# Patient Record
Sex: Male | Born: 1985 | Race: Black or African American | Hispanic: No | Smoking: Current every day smoker
Health system: Southern US, Community
[De-identification: ages and names within clinical notes are randomized; demographics above are authoritative.]

## PROBLEM LIST (undated history)

## (undated) ENCOUNTER — Ambulatory Visit

## (undated) DIAGNOSIS — D573 Sickle-cell trait: Secondary | ICD-10-CM

## (undated) HISTORY — PX: NO PAST SURGERIES: SHX2092

## (undated) HISTORY — DX: Sickle-cell trait: D57.3

---

## 2019-04-30 ENCOUNTER — Ambulatory Visit (HOSPITAL_COMMUNITY)
Admission: EM | Admit: 2019-04-30 | Discharge: 2019-04-30 | Disposition: A | Discharge status: Home / Self Care | Payer: Medicaid Other | Attending: Internal Medicine | Admitting: Internal Medicine

## 2019-04-30 ENCOUNTER — Encounter (HOSPITAL_COMMUNITY): Payer: Self-pay | Admitting: Emergency Medicine

## 2019-04-30 ENCOUNTER — Other Ambulatory Visit: Payer: Self-pay

## 2019-04-30 DIAGNOSIS — L84 Corns and callosities: Secondary | ICD-10-CM

## 2019-04-30 NOTE — ED Triage Notes (Signed)
Pt here for bilateral foot pain.

## 2019-04-30 NOTE — ED Provider Notes (Signed)
[facility]    CSN: [id number] Arrival date & time: 04/30/19  1035      History   Chief Complaint Chief Complaint  Patient presents with  . Foot Pain    HPI Robert Nunez is a 33 y.o. male with no past medical history comes to urgent care with complaints of pain in the left foot.  Patient has some calluses on his foot.  Left foot callus at the base of the left fifth toe is more prominent.  He has been shaving those calluses over the past 2 to 3 years.  He complains of worsening pain over the past few days.  Pain is constant, throbbing, worse with wearing shoes and denies any relieving factors.  Patient admits to having some whitish discharge from the area previously shaved.  No fever or chills.  No redness.Marland Kitchen   HPI  History reviewed. No pertinent past medical history.  There are no active problems to display for this patient.   History reviewed. No pertinent surgical history.     Home Medications    Prior to Admission medications   Not on File    Family History History reviewed. No pertinent family history.  Social History Social History   Tobacco Use  . Smoking status: Not on file  Substance Use Topics  . Alcohol use: Not on file  . Drug use: Not on file     Allergies   Patient has no allergy information on record.   Review of Systems Review of Systems  Constitutional: Negative for activity change, chills, fatigue and fever.  HENT: Negative.   Respiratory: Negative.   Gastrointestinal: Negative.   Genitourinary: Negative.   Musculoskeletal: Positive for arthralgias. Negative for back pain, joint swelling and myalgias.  Skin: Negative.   Neurological: Negative for dizziness, light-headedness and headaches.     Physical Exam Triage Vital Signs ED Triage Vitals [04/30/19 1120]  Enc Vitals Group     BP 107/72     Pulse Rate 69     Resp 18     Temp 97.9 F (36.6 C)     Temp Source Oral     SpO2 97 %     Weight    Height      Head Circumference      Peak Flow      Pain Score      Pain Loc      Pain Edu?      Excl. in [place]?    No data found.  Updated Vital Signs BP 107/72 (BP Location: Right Arm)   Pulse 69   Temp 97.9 F (36.6 C) (Oral)   Resp 18   SpO2 97%   Visual Acuity Right Eye Distance:   Left Eye Distance:   Bilateral Distance:    Right Eye Near:   Left Eye Near:    Bilateral Near:     Physical Exam Vitals signs and nursing note reviewed.  Constitutional:      General: He is not in acute distress.    Appearance: Normal appearance. He is not ill-appearing or toxic-appearing.  Cardiovascular:     Rate and Rhythm: Normal rate and regular rhythm.     Pulses: Normal pulses.     Heart sounds: Normal heart sounds.  Pulmonary:     Effort: Pulmonary effort is normal. No respiratory distress.     Breath sounds: Normal breath sounds. No wheezing or rales.  Abdominal:     General: Bowel sounds are normal.  There is no distension.     Palpations: Abdomen is soft.  Musculoskeletal:     Comments: Callus over the base of the left fifth toe.  Point tenderness.  It is obvious the callus has been shaved recently.  There is possible fluid collection that area.  Skin:    Capillary Refill: Capillary refill takes less than 2 seconds.  Neurological:     General: No focal deficit present.     Mental Status: He is alert.      UC Treatments / Results  Labs (all labs ordered are listed, but only abnormal results are displayed) Labs Reviewed - No data to display  EKG   Radiology No results found.  Procedures Incision and Drainage  Date/Time: 04/30/2019 1:07 PM Performed by: Merrilee JanskyLamptey,  O, MD Authorized by: Merrilee JanskyLamptey,  O, MD   Consent:    Consent obtained:  Verbal   Consent given by:  Patient   Risks discussed:  Bleeding and incomplete drainage   Alternatives discussed:  No treatment and delayed treatment Location:    Type:  Abscess   Location:  Lower extremity    Lower extremity location:  Foot   Foot location:  L foot Pre-procedure details:    Skin preparation:  Chloraprep Anesthesia (see MAR for exact dosages):    Anesthesia method:  Local infiltration   Local anesthetic:  Lidocaine 1% w/o epi Procedure type:    Complexity:  Simple Procedure details:    Incision types:  Single straight   Scalpel blade:  10   Drainage:  Bloody   Packing materials:  None Post-procedure details:    Patient tolerance of procedure:  Tolerated well, no immediate complications   (including critical care time)  Medications Ordered in UC Medications - No data to display  Initial Impression / Assessment and Plan / UC Course  I have reviewed the triage vital signs and the nursing notes.  Pertinent labs & imaging results that were available during my care of the patient were reviewed by me and considered in my medical decision making (see chart for details).     1.  Left foot callus with worsening pain-possible abscess: Incision and drainage did not yield any abscess. Tylenol/Motrin as needed for pain Local wound care. Patient is advised to follow-up with a podiatrist if he needs the calluses shaved or addressed Patient left the urgent care before formal discharge instructions were given. Final Clinical Impressions(s) / UC Diagnoses   Final diagnoses:  None   Discharge Instructions   None    ED Prescriptions    None     Controlled Substance Prescriptions [place] Controlled Substance Registry consulted? No   Merrilee JanskyLamptey,  O, MD 04/30/19 1308

## 2019-05-16 DIAGNOSIS — Z227 Latent tuberculosis: Secondary | ICD-10-CM

## 2019-05-16 HISTORY — DX: Latent tuberculosis: Z22.7

## 2019-05-25 ENCOUNTER — Encounter: Payer: Self-pay | Admitting: Family Medicine

## 2019-05-25 DIAGNOSIS — Z789 Other specified health status: Secondary | ICD-10-CM | POA: Insufficient documentation

## 2019-05-25 DIAGNOSIS — Z0289 Encounter for other administrative examinations: Secondary | ICD-10-CM | POA: Insufficient documentation

## 2019-05-25 DIAGNOSIS — Z603 Acculturation difficulty: Secondary | ICD-10-CM | POA: Insufficient documentation

## 2019-05-26 ENCOUNTER — Ambulatory Visit (INDEPENDENT_AMBULATORY_CARE_PROVIDER_SITE_OTHER): Payer: Medicaid Other | Admitting: Family Medicine

## 2019-05-26 ENCOUNTER — Other Ambulatory Visit: Payer: Self-pay

## 2019-05-26 VITALS — BP 100/62 | HR 77 | Ht 72.5 in | Wt 151.6 lb

## 2019-05-26 DIAGNOSIS — L84 Corns and callosities: Secondary | ICD-10-CM | POA: Diagnosis not present

## 2019-05-26 DIAGNOSIS — R52 Pain, unspecified: Secondary | ICD-10-CM | POA: Diagnosis not present

## 2019-05-26 DIAGNOSIS — Z0289 Encounter for other administrative examinations: Secondary | ICD-10-CM

## 2019-05-26 DIAGNOSIS — Z720 Tobacco use: Secondary | ICD-10-CM | POA: Diagnosis not present

## 2019-05-26 DIAGNOSIS — B070 Plantar wart: Secondary | ICD-10-CM | POA: Insufficient documentation

## 2019-05-26 DIAGNOSIS — R079 Chest pain, unspecified: Secondary | ICD-10-CM

## 2019-05-26 DIAGNOSIS — G44209 Tension-type headache, unspecified, not intractable: Secondary | ICD-10-CM | POA: Diagnosis not present

## 2019-05-26 MED ORDER — ACETAMINOPHEN 325 MG PO TABS: 650.0000 mg | ORAL_TABLET | Freq: Four times a day (QID) | ORAL | 3 refills | Status: DC | PRN

## 2019-05-26 NOTE — Patient Instructions (Addendum)
It was wonderful to see you today.  Thank you for choosing [facility].   Please call [phone] with any questions about today's appointment.  Please be sure to schedule follow up at the front  desk before you leave today.   Dorris Singh, MD  Family Medicine    Please go get your x-ray for the pain in your side  Tobacco use is damaging to your body. It increases your risk of stroke, heart attack, lung cancer, and serious lung disease in the future. It also reduces your fertility.   Quitting tobacco is the best thing for your health but is a challenge---nicotine, a chemical in cigarettes, is highly addictive.    Arabic Quitline, call 1300 [phone].                                     .             .             Marland Kitchen               .                       :      [place] ( )  :     Cold [place]             -        .     Cold [place]                               " "  .  :     :                 :    Nicotine Replacement Therapy (   ) (NRT) :               .   NRT                .     NRT          .      :        (Varenicline (Champix), Bupropion (Zyban  .         .  :      : the [place] Quitline (   )                .   [place]                  .                 .       (call-back)              .   [place]                 .    Quitline   [phone]             :    _0 .                                           .  :      :                        .                              .       :                                                                                                                     -         .             Marland Kitchen                             !             .                              Marland Kitchen                 Marland Kitchen               Marland Kitchen  Quitline     [phone]        .  Quit Kit (   )           .
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
## 2019-05-26 NOTE — Assessment & Plan Note (Signed)
Patient with likely tension headache. Normal neuro exam which is reassuring. No vision changes as well. Advised to use tylenol PRN. Strict return precautions given. Follow up PRN
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
## 2019-05-26 NOTE — Assessment & Plan Note (Signed)
Patient with Corn of foot. Instructed to use OTC products such as Dr. Zoe Lan. Follow up PRN

## 2019-05-26 NOTE — Assessment & Plan Note (Signed)
Congratulated patient on deciding to stop smoking. Provided resources for Arabic quit line as well as AVS with information on tobacco cessation. Follow up PRN

## 2019-05-28 ENCOUNTER — Encounter: Payer: Self-pay | Admitting: Family Medicine

## 2019-05-29 ENCOUNTER — Telehealth: Payer: Self-pay

## 2019-05-29 ENCOUNTER — Encounter: Payer: Self-pay | Admitting: Family Medicine

## 2019-05-29 ENCOUNTER — Other Ambulatory Visit: Payer: Self-pay

## 2019-05-29 ENCOUNTER — Ambulatory Visit (INDEPENDENT_AMBULATORY_CARE_PROVIDER_SITE_OTHER): Payer: Medicaid Other | Admitting: Family Medicine

## 2019-05-29 ENCOUNTER — Ambulatory Visit: Payer: Medicaid Other | Admitting: Family Medicine

## 2019-05-29 ENCOUNTER — Ambulatory Visit
Admission: RE | Admit: 2019-05-29 | Discharge: 2019-05-29 | Disposition: A | Discharge status: Home / Self Care | Payer: Medicaid Other | Source: Ambulatory Visit | Attending: Family Medicine | Admitting: Family Medicine

## 2019-05-29 VITALS — BP 100/65 | HR 66

## 2019-05-29 DIAGNOSIS — R7612 Nonspecific reaction to cell mediated immunity measurement of gamma interferon antigen response without active tuberculosis: Secondary | ICD-10-CM | POA: Diagnosis not present

## 2019-05-29 DIAGNOSIS — D573 Sickle-cell trait: Secondary | ICD-10-CM | POA: Diagnosis not present

## 2019-05-29 DIAGNOSIS — Z0289 Encounter for other administrative examinations: Secondary | ICD-10-CM | POA: Diagnosis not present

## 2019-05-29 LAB — POCT URINALYSIS DIP (MANUAL ENTRY)
Bilirubin, UA: NEGATIVE
Blood, UA: NEGATIVE
Glucose, UA: NEGATIVE mg/dL
Ketones, POC UA: NEGATIVE mg/dL
Leukocytes, UA: NEGATIVE
Nitrite, UA: NEGATIVE
Protein Ur, POC: NEGATIVE mg/dL
Spec Grav, UA: 1.025 (ref 1.010–1.025)
Urobilinogen, UA: 0.2 E.U./dL
pH, UA: 5.5 (ref 5.0–8.0)

## 2019-05-29 LAB — HGB FRAC. W/SOLUBILITY
Hgb A2 Quant: 4 % — ABNORMAL HIGH (ref 1.8–3.2)
Hgb A: 59.3 % — ABNORMAL LOW (ref 96.4–98.8)
Hgb C: 0 %
Hgb F Quant: 0 % (ref 0.0–2.0)
Hgb S: 36.7 % — ABNORMAL HIGH
Hgb Solubility: POSITIVE — AB
Hgb Variant: 0 %

## 2019-05-29 LAB — COMPREHENSIVE METABOLIC PANEL
ALT: 18 IU/L (ref 0–44)
AST: 20 IU/L (ref 0–40)
Albumin/Globulin Ratio: 1.6 (ref 1.2–2.2)
Albumin: 4.6 g/dL (ref 4.1–5.2)
Alkaline Phosphatase: 51 IU/L (ref 39–117)
BUN/Creatinine Ratio: 12 (ref 9–20)
BUN: 8 mg/dL (ref 6–20)
Bilirubin Total: 0.5 mg/dL (ref 0.0–1.2)
CO2: 21 mmol/L (ref 20–29)
Calcium: 9.3 mg/dL (ref 8.7–10.2)
Chloride: 103 mmol/L (ref 96–106)
Creatinine, Ser: 0.69 mg/dL — ABNORMAL LOW (ref 0.76–1.27)
GFR calc Af Amer: 147 mL/min/{1.73_m2} (ref >59)
GFR calc non Af Amer: 127 mL/min/{1.73_m2} (ref >59)
Globulin, Total: 2.8 g/dL (ref 1.5–4.5)
Glucose: 91 mg/dL (ref 65–99)
Potassium: 4.3 mmol/L (ref 3.5–5.2)
Sodium: 139 mmol/L (ref 134–144)
Total Protein: 7.4 g/dL (ref 6.0–8.5)

## 2019-05-29 LAB — QUANTIFERON-TB GOLD PLUS
QuantiFERON Mitogen Value: >10 IU/mL
QuantiFERON Nil Value: 0.19 IU/mL
QuantiFERON TB1 Ag Value: 4.66 IU/mL
QuantiFERON TB2 Ag Value: 3.64 IU/mL
QuantiFERON-TB Gold Plus: POSITIVE — AB

## 2019-05-29 LAB — HEPATITIS B CORE ANTIBODY, TOTAL: Hep B Core Total Ab: NEGATIVE

## 2019-05-29 LAB — HEPATITIS B SURFACE ANTIBODY, QUANTITATIVE: Hepatitis B Surf Ab Quant: <3.1 m[IU]/mL — ABNORMAL LOW (ref >9.9)

## 2019-05-29 LAB — CBC WITH DIFFERENTIAL/PLATELET
Basophils Absolute: 0 10*3/uL (ref 0.0–0.2)
Basos: 1 %
EOS (ABSOLUTE): 0.2 10*3/uL (ref 0.0–0.4)
Eos: 3 %
Hematocrit: 41.8 % (ref 37.5–51.0)
Hemoglobin: 14.2 g/dL (ref 13.0–17.7)
Immature Grans (Abs): 0 10*3/uL (ref 0.0–0.1)
Immature Granulocytes: 0 %
Lymphocytes Absolute: 2.6 10*3/uL (ref 0.7–3.1)
Lymphs: 34 %
MCH: 27.7 pg (ref 26.6–33.0)
MCHC: 34 g/dL (ref 31.5–35.7)
MCV: 82 fL (ref 79–97)
Monocytes Absolute: 0.8 10*3/uL (ref 0.1–0.9)
Monocytes: 10 %
Neutrophils Absolute: 4.1 10*3/uL (ref 1.4–7.0)
Neutrophils: 52 %
Platelets: 245 10*3/uL (ref 150–450)
RBC: 5.12 x10E6/uL (ref 4.14–5.80)
RDW: 12.6 % (ref 11.6–15.4)
WBC: 7.8 10*3/uL (ref 3.4–10.8)

## 2019-05-29 LAB — HEPATITIS B SURFACE ANTIGEN: Hepatitis B Surface Ag: NEGATIVE

## 2019-05-29 LAB — LIPID PANEL
Chol/HDL Ratio: 4.3 ratio (ref 0.0–5.0)
Cholesterol, Total: 165 mg/dL (ref 100–199)
HDL: 38 mg/dL — ABNORMAL LOW (ref >39)
LDL Calculated: 108 mg/dL — ABNORMAL HIGH (ref 0–99)
Triglycerides: 94 mg/dL (ref 0–149)
VLDL Cholesterol Cal: 19 mg/dL (ref 5–40)

## 2019-05-29 LAB — VARICELLA ZOSTER ANTIBODY, IGG: Varicella zoster IgG: 1538 index (ref >165)

## 2019-05-29 LAB — RPR: RPR Ser Ql: NONREACTIVE

## 2019-05-29 LAB — TSH: TSH: 1.22 u[IU]/mL (ref 0.450–4.500)

## 2019-05-29 LAB — HIV ANTIBODY (ROUTINE TESTING W REFLEX): HIV Screen 4th Generation wRfx: NONREACTIVE

## 2019-05-29 LAB — HCV COMMENT:

## 2019-05-29 LAB — HEPATITIS C ANTIBODY (REFLEX): HCV Ab: 0.3 s/co ratio (ref 0.0–0.9)

## 2019-05-29 NOTE — Patient Instructions (Addendum)
It was a pleasure seeing you today.   Today we discussed your lab results   For your labs:   Your Tuberculosis test was positive. We will get a chest xray. You can go today The address is [facility]  [address] [address]  Your sickle cell test shows you have sickle cell trait. THIS DOES NOT MEAN YOU HAVE THE DISEASE. Sickle cell trait means you can pass it down to your children. They have been tested already.   You will need to come back to get vaccinations. I have scheduled you for 06/15/19 @ 10:30AM.   Please follow up in 3 weeks for your vaccines or sooner if symptoms persist or worsen. Please call the clinic immediately if you have any concerns.   Our clinic's number is [phone]. Please call with questions or concerns.   Please go to the emergency room if fever, chest pain, shortness of breath, weight loss  Thank you,  Caroline More, DO

## 2019-05-29 NOTE — Assessment & Plan Note (Signed)
Discussed in detail diagnosis and how it is passed down from generation.  Drew pictures to describe what sickling of cells meant.  Patient verbalized that he now understood the diagnosis and will wait to see what his children's test results are.  He states he is unsure what his wife sickle cell status is.

## 2019-05-29 NOTE — Progress Notes (Signed)
   Subjective:    Patient ID: Robert Nunez, male    DOB: 1985-11-07, 33 y.o.   MRN: [id number]   CC: f/u lab work   HPI: F/u lab work Patient with recent visit on 8/11 with myself and Dr. Owens Shark for refugee clinic.  At that time lab work was obtained.  Sickle cell trait was shown.  Hepatitis C was negative.  TSH was within normal limits.  Lipid panel showed slightly elevated LDL of 108, this was not a fasting lab.  RPR was nonreactive hepatitis B antibody was low so will need hepatitis B series.  Varicella-zoster showed immunity.  HIV was nonreactive  Patient reports he will begin working for [employer] soon.  Reports that they do not require vaccinations prior to starting.  Patient is due for hepatitis B vaccine series, TDAP, hepatitis A vaccine, and recommended PCV 23 due to tobacco history.  Will need to make nursing visit to have these done.  Nursing visit made for 8/31 in the hopes that we will have vaccinations at that time.  Patient reports he has never been told that he had sickle cell trait.  Had lots of questions on what sickle cell disease was and how it is transferred from person to person.  Denies any shortness of breath, chest pain, cough, fever, weight changes.  Objective:  BP 100/65   Pulse 66   SpO2 99%  Vitals and nursing note reviewed  General: well nourished, in no acute distress HEENT: normocephalic, no scleral icterus or conjunctival pallor  Neck: supple  Cardiac: RRR, clear S1 and S2, no murmurs, rubs, or gallops Respiratory: clear to auscultation bilaterally, no increased work of breathing Abdomen: soft, nontender, nondistended, no masses or organomegaly. Bowel sounds present Extremities: no edema or cyanosis. Warm, well perfused.   Skin: warm and dry, no rashes noted Neuro: alert and oriented, no focal deficits   Assessment & Plan:    Positive QuantiFERON-TB Gold test Patient with positive QuantiFERON gold.  Will obtain chest x-ray.  If  chest x-ray is positive will need to refer to our CID for treatment of active tuberculosis.  If chest x-ray is negative will refer to health department for latent tuberculosis follow-up.  I have scheduled this patient with [facility] imaging and CMA called and confirmed that they will have an Arabic interpreter waiting when patient arrives.  Patient also contacted his case coordinator who will drive patient to [facility] imaging.  We offered bus passes as well if they were unable to get this transportation.  Strict return precautions given.  Sickle cell trait ([place]) Discussed in detail diagnosis and how it is passed down from generation.  Drew pictures to describe what sickling of cells meant.  Patient verbalized that he now understood the diagnosis and will wait to see what his children's test results are.  He states he is unsure what his wife sickle cell status is.  Encounter for health examination of refugee Patient is due for several vaccinations including Tdap, hepatitis B vaccine series, hepatitis A, PCV 23.  Unfortunately we do not have vaccinations at this time.  Discussed this with patient.  He states that his employer does not require these vaccinations to begin working.  Have schedule patient for nurse clinic visit on 06/15/2019.  If vaccinations are still not available by then patient will need to be called to reschedule this.    Return in about 2 weeks (around 06/12/2019).   Caroline More, DO, PGY-3

## 2019-05-29 NOTE — Telephone Encounter (Signed)
Informed patient using [employer] ID # [id number].  Robert Nunez, [place]

## 2019-05-29 NOTE — Assessment & Plan Note (Signed)
Patient is due for several vaccinations including Tdap, hepatitis B vaccine series, hepatitis A, PCV 23.  Unfortunately we do not have vaccinations at this time.  Discussed this with patient.  He states that his employer does not require these vaccinations to begin working.  Have schedule patient for nurse clinic visit on 06/15/2019.  If vaccinations are still not available by then patient will need to be called to reschedule this.

## 2019-05-29 NOTE — Assessment & Plan Note (Signed)
Patient with positive QuantiFERON gold.  Will obtain chest x-ray.  If chest x-ray is positive will need to refer to our CID for treatment of active tuberculosis.  If chest x-ray is negative will refer to health department for latent tuberculosis follow-up.  I have scheduled this patient with [facility] imaging and CMA called and confirmed that they will have an Arabic interpreter waiting when patient arrives.  Patient also contacted his case coordinator who will drive patient to [facility] imaging.  We offered bus passes as well if they were unable to get this transportation.  Strict return precautions given.

## 2019-06-02 ENCOUNTER — Telehealth: Payer: Self-pay

## 2019-06-02 ENCOUNTER — Telehealth: Payer: Self-pay | Admitting: Family Medicine

## 2019-06-02 NOTE — Telephone Encounter (Signed)
Patient with positive QuantiFERON gold and negative chest x-ray.  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  Have called and spoken to the health department who will now take over treatment of this.  Have faxed over appropriate information and they have inform you that they will plan to call patient and inform him of the next plans and work-up and treatment.  Have stressed to health department that patient does not speak [place], informed him he speaks Arabic.  Dalphine Handing, PGY-3 [facility] 06/02/2019 2:24 PM

## 2019-06-02 NOTE — Telephone Encounter (Signed)
Called patient using [employer] ID # [id number].  LVM for patient concerning results and follow up appointment with Health Department.  Ozella Almond, [place]

## 2019-06-15 ENCOUNTER — Ambulatory Visit: Payer: Medicaid Other

## 2019-06-18 DIAGNOSIS — R7612 Nonspecific reaction to cell mediated immunity measurement of gamma interferon antigen response without active tuberculosis: Secondary | ICD-10-CM | POA: Diagnosis not present

## 2019-07-07 ENCOUNTER — Telehealth: Payer: Self-pay | Admitting: Family Medicine

## 2019-07-07 NOTE — Telephone Encounter (Signed)
Patient initially referred to HD for positive Quant Gold. Unable to have proper follow up due to language barrier.   I was able to call and get in contact with HD regarding TB appointment. They were able to get an appointment for 9/24 @2PM  for follow up. [address]   Immunization appointment also made for husband at HD.   Information told to patient's wife when she was in clinic per patient's request.   Caroline More, DO, PGY-3 [facility] 07/07/2019 3:08 PM

## 2019-07-09 ENCOUNTER — Ambulatory Visit: Payer: Medicaid Other

## 2019-07-13 ENCOUNTER — Encounter: Payer: Self-pay | Admitting: Family Medicine

## 2019-07-13 ENCOUNTER — Ambulatory Visit (INDEPENDENT_AMBULATORY_CARE_PROVIDER_SITE_OTHER): Payer: Medicaid Other | Admitting: Family Medicine

## 2019-07-13 ENCOUNTER — Other Ambulatory Visit: Payer: Self-pay

## 2019-07-13 ENCOUNTER — Ambulatory Visit: Payer: Medicaid Other | Admitting: Family Medicine

## 2019-07-13 DIAGNOSIS — B070 Plantar wart: Secondary | ICD-10-CM | POA: Diagnosis not present

## 2019-07-13 DIAGNOSIS — D573 Sickle-cell trait: Secondary | ICD-10-CM | POA: Diagnosis not present

## 2019-07-13 DIAGNOSIS — Z23 Encounter for immunization: Secondary | ICD-10-CM

## 2019-07-13 DIAGNOSIS — J329 Chronic sinusitis, unspecified: Secondary | ICD-10-CM | POA: Diagnosis not present

## 2019-07-13 MED ORDER — CETIRIZINE HCL 10 MG PO TABS: 10.0000 mg | ORAL_TABLET | Freq: Every day | ORAL | 11 refills | Status: DC

## 2019-07-14 DIAGNOSIS — Z23 Encounter for immunization: Secondary | ICD-10-CM | POA: Diagnosis not present

## 2019-07-14 DIAGNOSIS — R7612 Nonspecific reaction to cell mediated immunity measurement of gamma interferon antigen response without active tuberculosis: Secondary | ICD-10-CM | POA: Diagnosis not present

## 2019-07-14 NOTE — Assessment & Plan Note (Signed)
Mild.  Rx with daily cetirizine.

## 2019-07-14 NOTE — Assessment & Plan Note (Signed)
My clinical dx is of plantar wart rather than corn.  Treated as such with cryotherapy.  Told may recur and that early refreeze would be good.

## 2019-07-14 NOTE — Assessment & Plan Note (Signed)
Return with wife to PCP for visit dedicated to discuss their concerns around having more children.

## 2019-07-14 NOTE — Progress Notes (Signed)
Established Patient Office Visit  Subjective:  Patient ID: Robert Nunez, male    DOB: 1986/05/06  Age: 33 y.o. MRN: [id number]  CC: No chief complaint on file.   HPI Kemontae Junco presents for foot problem and more. Interpretor Yasmin [id number]   Patient has had a left foot problem for two years.  It has been characterized as a corn in at least one visit.  Hurts to walk.  Previous treatments have provided only temporary relief. Second issue is "sinus problems."  C/O chronic nasal congestion and bilateral facial pressure.  No current meds including no over the counter meds.  Worse recently with the change in seasons.  No fever or constitutional symptoms. After his foot treatment and I was wrapping up, he asked about his genetic problem and should he have more babies.  I believe he was referring to his sickle trait.  Apparently his spouse must also have sickle trait and it sounds like they have a child with SS disease.  I told him there was a 1 in four chance of SS disease with future pregnancies.  He asked again if he should have more babies.  Given the importance of the discussion, I suggested that he and spouse return to just discuss that issue with PCP.  Past Medical History:  Diagnosis Date  . Sickle cell trait (HCC)     History reviewed. No pertinent surgical history.  Family History  Problem Relation Age of Onset  . Diabetes Mother     Social History   Socioeconomic History  . Marital status: Married    Spouse name: Not on file  . Number of children: Not on file  . Years of education: Not on file  . Highest education level: Not on file  Occupational History  . Not on file  Social Needs  . Financial resource strain: Not on file  . Food insecurity    Worry: Not on file    Inability: Not on file  . Transportation needs    Medical: Not on file    Non-medical: Not on file  Tobacco Use  . Smoking status: Current Every Day Smoker  . Smokeless tobacco: Never Used  .  Tobacco comment: 15 cig/day  Substance and Sexual Activity  . Alcohol use: Never    Frequency: Never  . Drug use: Never  . Sexual activity: Yes    Partners: Female  Lifestyle  . Physical activity    Days per week: Not on file    Minutes per session: Not on file  . Stress: Not on file  Relationships  . Social Musician on phone: Not on file    Gets together: Not on file    Attends religious service: Not on file    Active member of club or organization: Not on file    Attends meetings of clubs or organizations: Not on file    Relationship status: Not on file  . Intimate partner violence    Fear of current or ex partner: Not on file    Emotionally abused: Not on file    Physically abused: Not on file    Forced sexual activity: Not on file  Other Topics Concern  . Not on file  Social History Narrative  . Not on file    Outpatient Medications Prior to Visit  Medication Sig Dispense Refill  . acetaminophen (TYLENOL) 325 MG tablet Take 2 tablets (650 mg total) by mouth every 6 (six) hours as needed. 60  tablet 3   No facility-administered medications prior to visit.     No Known Allergies  ROS Review of Systems    Objective:    Physical Exam  BP 98/70   Pulse 69   Wt 147 lb 9.6 oz (67 kg)   SpO2 99%   BMI 19.74 kg/m  Wt Readings from Last 3 Encounters:  07/13/19 147 lb 9.6 oz (67 kg)  05/26/19 151 lb 9.6 oz (68.8 kg)  HEENT TMs normal  Nose blue boggy mucosa Throat normal Neck no sig nodes. Left foot large callous over lateral aspect of 5th metatarsal head.  Callous shaved to reveal a well-circumscribed plantar wart.  Wart frozen using liquid nitrogen.  Verbal instructions given since cannot read English.   Health Maintenance Due  Topic Date Due  . TETANUS/TDAP  09/23/2007    There are no preventive care reminders to display for this patient.  Lab Results  Component Value Date   TSH 1.220 05/26/2019   Lab Results  Component Value Date    WBC 7.8 05/26/2019   HGB 14.2 05/26/2019   HCT 41.8 05/26/2019   MCV 82 05/26/2019   PLT 245 05/26/2019   Lab Results  Component Value Date   NA 139 05/26/2019   K 4.3 05/26/2019   CO2 21 05/26/2019   GLUCOSE 91 05/26/2019   BUN 8 05/26/2019   CREATININE 0.69 (L) 05/26/2019   BILITOT 0.5 05/26/2019   ALKPHOS 51 05/26/2019   AST 20 05/26/2019   ALT 18 05/26/2019   PROT 7.4 05/26/2019   ALBUMIN 4.6 05/26/2019   CALCIUM 9.3 05/26/2019   Lab Results  Component Value Date   CHOL 165 05/26/2019   Lab Results  Component Value Date   HDL 38 (L) 05/26/2019   Lab Results  Component Value Date   LDLCALC 108 (H) 05/26/2019   Lab Results  Component Value Date   TRIG 94 05/26/2019   Lab Results  Component Value Date   CHOLHDL 4.3 05/26/2019   No results found for: HGBA1C    Assessment & Plan:   Problem List Items Addressed This Visit    Chronic sinusitis   Relevant Medications   cetirizine (ZYRTEC) 10 MG tablet    Other Visit Diagnoses    Need for immunization against influenza       Relevant Orders   Flu Vaccine QUAD 36+ mos IM (Completed)      Meds ordered this encounter  Medications  . cetirizine (ZYRTEC) 10 MG tablet    Sig: Take 1 tablet (10 mg total) by mouth daily.    Dispense:  30 tablet    Refill:  11    Follow-up: No follow-ups on file.    Zenia Resides, MD

## 2019-08-03 ENCOUNTER — Ambulatory Visit (INDEPENDENT_AMBULATORY_CARE_PROVIDER_SITE_OTHER): Payer: Medicaid Other | Admitting: Family Medicine

## 2019-08-03 ENCOUNTER — Other Ambulatory Visit: Payer: Self-pay

## 2019-08-03 ENCOUNTER — Encounter: Payer: Self-pay | Admitting: Family Medicine

## 2019-08-03 VITALS — BP 100/70 | HR 66 | Wt 152.0 lb

## 2019-08-03 DIAGNOSIS — D573 Sickle-cell trait: Secondary | ICD-10-CM | POA: Diagnosis not present

## 2019-08-03 DIAGNOSIS — Z0289 Encounter for other administrative examinations: Secondary | ICD-10-CM

## 2019-08-03 NOTE — Patient Instructions (Signed)
What You Should Know About Sickle Cell Trait What Is Sickle Cell Trait? Sickle cell trait (SCT) is not a disease, but having it means that a person has inherited the sickle cell gene from one of his or her parents. People with SCT usually do not have any of the symptoms of sickle cell disease (SCD) and live a normal life. What Is Sickle Cell Disease? SCD is a genetic condition that is present at birth. In SCD, the red blood cells become hard and sticky and look like a C-shaped farm tool called a "sickle." The sickle cells die early, which causes a constant shortage of red blood cells. Also, when they travel through small blood vessels, they get stuck and clog the blood flow. This can cause pain and other serious problems. It is inherited when a child receives two sickle cell genes-one from each parent. A person with SCD can pass the disease or SCT on to his or her children. How Does Someone Get Sickle Cell Trait? People who have inherited one sickle cell gene and one normal gene have SCT. This means the person won't have the disease, but will be a trait "carrier" and can pass it on to his or her children. Who Is Affected By Sickle Cell Trait? SCT affects 1 in 12 Blacks or African Americans in the [place].  SCT is most common among Blacks or African Americans, but can be found among people whose ancestors come from [place] the [place] the [place], and [place]; [place]; and [place] countries such as [place], [place], and [place].  Approximately 3 million people living in the [place] have SCT and many are unaware of their status. What Are The Chances That A Baby Will Have Sickle Cell Trait  If both parents have SCT, there is a 50% (or 1 in 2) chance that the child also will have SCT if the child inherits the sickle cell gene from one of the parents. Such children will not have symptoms of SCD, but they can pass SCT on to their  children.  If both parents have SCT, there is a 25% (or 1 in 4) chance that the child will have SCD.  There is the same 25% (or 1 in 4) chance that the child will not have SCD or SCT.  If one parent has SCT, there is a 50% (or 1 in 2) chance that the child will have SCT and an equal 50% chance that the child will not have SCT. What Health Complications Are Associated With Sickle Cell Trait? Most people with SCT do not have any symptoms of SCD, although-in rare cases-people with SCT might experience complications of SCD, such as "pain crises" and, in extreme circumstances, sudden death. More research is needed to find out why some people with SCT have complications and others do not. In their extreme form and in rare cases, the following conditions could be harmful for people with SCT:  Increased pressure in the atmosphere (e.g., while scuba diving).  Low oxygen levels in the air (e.g., when mountain climbing, exercising extremely hard in [place], or training for an athletic competition).  Dehydration (e.g., too little water in the body).  High altitudes (e.g., flying, mountain climbing, or visiting a city at a high altitude). How Will A Person Know If He Or She Has Sickle Cell Trait? A simple blood test can be done to find out if someone has SCT.  Testing is available at most hospitals  or medical centers, from SCD community-based organizations, or at local health departments.  A small sample of blood is taken from the finger (a "needle prick") and evaluated in a laboratory.  If the results of the test reveal that someone has SCT, it is important that he or she know what SCT is, how it can affect him or her, and if and how SCD runs in his or her family. The best way to find out if and how SCD runs in a person's family is for the person to see a genetic counselor. These professionals have experience with genetic blood disorders. The genetic counselor will look at the person's  family history and discuss with him or her what is known about SCD in the person's family. It is best for a person with SCD to learn all he or she can about this disease before deciding to have children. For more information visit: www.cdc.gov/sicklecell This information is not intended to replace advice given to you by your health care provider. Make sure you discuss any questions you have with your health care provider. Document Released: 10/13/2018 Document Revised: 10/20/2018 Document Reviewed: 10/13/2018 Elsevier Patient Education  2020 Elsevier Inc.  

## 2019-08-03 NOTE — Progress Notes (Signed)
   Subjective:    Patient ID: Robert Nunez, male    DOB: 12-Jun-1986, 33 y.o.   MRN: [id number]   CC: discuss sickle cell trait  HPI: Discuss sickle cell trait Patient presenting today to discuss his sickle cell trait.  He is very concerned about having future children as he is scared to give them sickle cell disease.  Would like to discuss what sickle cell disease is and how it is passed on to his children.  Is otherwise asymptomatic.  Patient's wife has hemoglobin ligament disease.  Patient's children also have sickle cell trait.  Is wondering if he and his wife decided to have a future child if they would have sickle cell disease.   Objective:  BP 100/70   Pulse 66   Wt 152 lb (68.9 kg)   SpO2 99%   BMI 20.33 kg/m  Vitals and nursing note reviewed  General: well nourished, in no acute distress HEENT: normocephalic Neck: supple, non-tender, without lymphadenopathy Cardiac: RRR, clear S1 and S2, no murmurs, rubs, or gallops Respiratory: clear to auscultation bilaterally, no increased work of breathing Abdomen: soft, nontender, nondistended, no masses or organomegaly. Bowel sounds present Extremities: no edema or cyanosis. Warm, well perfused. Skin: warm and dry, no rashes noted Neuro: alert and oriented, no focal deficits   Assessment & Plan:    Encounter for health examination of refugee Patient reports that he went to the health department to get his remaining vaccinations.  Sickle cell trait ([name]) Patient with sickle cell trait and wife with hemoglobin ligament disease.  Is concerned about future children.  I explained to them what sickle cell disease was and drew examples of normal blood cells as well as sickle blood cells.  I also explained to them how sickle cell is inherited in the transmission.  After a lengthy discussion I recommended that they see a genetic counselor as they may be better able to explain this to them and to test for further transmissible diseases.   Patient is very appreciative of this.  I have placed a referral and explained in referral that patient does not speak English and a letter informing of appointment time may be beneficial.  [facility]up at [facility] as needed.    Return if symptoms worsen or fail to improve.   Caroline More, DO, PGY-3

## 2019-08-03 NOTE — Assessment & Plan Note (Signed)
Patient reports that he went to the health department to get his remaining vaccinations.

## 2019-08-03 NOTE — Assessment & Plan Note (Signed)
Patient with sickle cell trait and wife with hemoglobin ligament disease.  Is concerned about future children.  I explained to them what sickle cell disease was and drew examples of normal blood cells as well as sickle blood cells.  I also explained to them how sickle cell is inherited in the transmission.  After a lengthy discussion I recommended that they see a genetic counselor as they may be better able to explain this to them and to test for further transmissible diseases.  Patient is very appreciative of this.  I have placed a referral and explained in referral that patient does not speak English and a letter informing of appointment time may be beneficial.  Follow-up at [facility] as needed.

## 2019-08-04 DIAGNOSIS — Z0100 Encounter for examination of eyes and vision without abnormal findings: Secondary | ICD-10-CM | POA: Diagnosis not present

## 2019-08-13 DIAGNOSIS — R7612 Nonspecific reaction to cell mediated immunity measurement of gamma interferon antigen response without active tuberculosis: Secondary | ICD-10-CM | POA: Diagnosis not present

## 2019-08-13 DIAGNOSIS — Z23 Encounter for immunization: Secondary | ICD-10-CM | POA: Diagnosis not present

## 2019-09-17 ENCOUNTER — Other Ambulatory Visit: Payer: Self-pay

## 2019-09-17 ENCOUNTER — Ambulatory Visit (HOSPITAL_COMMUNITY)
Admission: EM | Admit: 2019-09-17 | Discharge: 2019-09-17 | Disposition: A | Discharge status: Home / Self Care | Payer: Medicaid Other | Attending: Internal Medicine | Admitting: Internal Medicine

## 2019-09-17 ENCOUNTER — Encounter (HOSPITAL_COMMUNITY): Payer: Self-pay

## 2019-09-17 DIAGNOSIS — F17200 Nicotine dependence, unspecified, uncomplicated: Secondary | ICD-10-CM | POA: Insufficient documentation

## 2019-09-17 DIAGNOSIS — B349 Viral infection, unspecified: Secondary | ICD-10-CM | POA: Insufficient documentation

## 2019-09-17 DIAGNOSIS — U071 COVID-19: Secondary | ICD-10-CM | POA: Insufficient documentation

## 2019-09-17 DIAGNOSIS — R519 Headache, unspecified: Secondary | ICD-10-CM | POA: Diagnosis present

## 2019-09-17 DIAGNOSIS — D573 Sickle-cell trait: Secondary | ICD-10-CM | POA: Insufficient documentation

## 2019-09-17 DIAGNOSIS — Z79899 Other long term (current) drug therapy: Secondary | ICD-10-CM | POA: Insufficient documentation

## 2019-09-17 DIAGNOSIS — Z8616 Personal history of COVID-19: Secondary | ICD-10-CM

## 2019-09-17 HISTORY — DX: Personal history of COVID-19: Z86.16

## 2019-09-17 NOTE — ED Provider Notes (Signed)
[place]    CSN: [id number] Arrival date & time: 09/17/19  6237      History   Chief Complaint Chief Complaint  Patient presents with  . Headache    HPI Robert Nunez is a 33 y.o. male with history of sickle cell trait comes to urgent care with a 2-day history of headaches, generalized body aches and some malaise.  Headache is global and of moderate severity.  No aggravating or relieving factors.  Denies any neck pain or stiffness.  No nausea or vomiting.  No diarrhea.  Patient works at the [employer] and denies any sick contacts.  Denies knowledge of COVID-19 outbreak at the [place].  No loss of taste or smell.   HPI  Past Medical History:  Diagnosis Date  . Sickle cell trait [facility])     Patient Active Problem List   Diagnosis Date Noted  . Chronic sinusitis 07/13/2019  . Sickle cell trait ([place]) 05/29/2019  . 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 Pain of right side of body 05/26/2019  . Plantar wart of left foot 05/26/2019  . Tension headache 05/26/2019  . Tobacco abuse 05/26/2019  . Encounter for health examination of refugee 05/25/2019  . Language barrier affecting health care 05/25/2019    History reviewed. No pertinent surgical history.     Home Medications    Prior to Admission medications   Medication Sig Start Date End Date Taking? Authorizing Provider  acetaminophen (TYLENOL) 325 MG tablet Take 2 tablets (650 mg total) by mouth every 6 (six) hours as needed. 05/26/19   Caroline More, DO  cetirizine (ZYRTEC) 10 MG tablet Take 1 tablet (10 mg total) by mouth daily. 07/13/19   Zenia Resides, MD    Family History Family History  Problem Relation Age of Onset  . Diabetes Mother     Social History Social History   Tobacco Use  . Smoking status: Current Every Day Smoker  . Smokeless tobacco: Never Used  . Tobacco comment: 15 cig/day  Substance Use Topics  . Alcohol use: Never    Frequency: Never  . Drug  use: Never     Allergies   Patient has no known allergies.   Review of Systems Review of Systems  Constitutional: Positive for fatigue. Negative for activity change.  HENT: Negative.   Eyes: Negative.   Respiratory: Negative.   Cardiovascular: Negative.   Gastrointestinal: Negative.   Genitourinary: Negative.   Musculoskeletal: Positive for arthralgias and myalgias.  Skin: Negative.   Neurological: Positive for headaches.  Psychiatric/Behavioral: Negative for confusion and decreased concentration.     Physical Exam Triage Vital Signs ED Triage Vitals  Enc Vitals Group     BP 09/17/19 0932 119/81     Pulse Rate 09/17/19 0932 71     Resp 09/17/19 0932 16     Temp 09/17/19 0932 99 F (37.2 C)     Temp Source 09/17/19 0932 Oral     SpO2 09/17/19 0932 99 %     Weight --      Height --      Head Circumference --      Peak Flow --      Pain Score 09/17/19 0931 3     Pain Loc --      Pain Edu? --      Excl. in [place]? --    No data found.  Updated Vital Signs BP 119/81 (BP Location: Left Arm)   Pulse 71  Temp 99 F (37.2 C) (Oral)   Resp 16   SpO2 99%   Visual Acuity Right Eye Distance:   Left Eye Distance:   Bilateral Distance:    Right Eye Near:   Left Eye Near:    Bilateral Near:     Physical Exam Vitals signs and nursing note reviewed.  Constitutional:      Appearance: He is not ill-appearing or toxic-appearing.  Eyes:     General: No visual field deficit or scleral icterus.    Extraocular Movements: Extraocular movements intact.     Pupils: Pupils are equal.  Neck:     Musculoskeletal: Normal range of motion.  Cardiovascular:     Rate and Rhythm: Normal rate and regular rhythm.     Heart sounds: Normal heart sounds. No murmur. No friction rub.  Pulmonary:     Effort: Pulmonary effort is normal. No respiratory distress.     Breath sounds: Normal breath sounds. No rhonchi or rales.  Abdominal:     General: Bowel sounds are normal. There is no  distension.     Palpations: Abdomen is soft.     Tenderness: There is no abdominal tenderness. There is no guarding.  Musculoskeletal: Normal range of motion.        General: No swelling or tenderness.  Skin:    General: Skin is warm.     Capillary Refill: Capillary refill takes less than 2 seconds.  Neurological:     Mental Status: He is alert.     GCS: GCS eye subscore is 4. GCS verbal subscore is 5. GCS motor subscore is 6.     Cranial Nerves: No cranial nerve deficit.      UC Treatments / Results  Labs (all labs ordered are listed, but only abnormal results are displayed) Labs Reviewed  NOVEL CORONAVIRUS, NAA (HOSP ORDER, SEND-OUT TO REF LAB; TAT 18-24 HRS)    EKG   Radiology No results found.  Procedures Procedures (including critical care time)  Medications Ordered in UC Medications - No data to display  Initial Impression / Assessment and Plan / UC Course  I have reviewed the triage vital signs and the nursing notes.  Pertinent labs & imaging results that were available during my care of the patient were reviewed by me and considered in my medical decision making (see chart for details).     1.  Acute viral illness: COVID-19 testing done Patient is advised to self isolate until COVID-19 test results are available Over-the-counter Tylenol for headaches or fever Patient is advised to increase oral fluid intake Work excuse given to the patient. If patient's medical condition worsens he is advised to return to urgent care. Final Clinical Impressions(s) / UC Diagnoses   Final diagnoses:  Viral syndrome   Discharge Instructions   None    ED Prescriptions    None     PDMP not reviewed this encounter.   Merrilee Jansky, MD 09/17/19 1045

## 2019-09-17 NOTE — ED Triage Notes (Signed)
Patient presents to Urgent Care with complaints of headache since two days ago. Patient reports he has not been taking medication for it.

## 2019-09-18 LAB — NOVEL CORONAVIRUS, NAA (HOSP ORDER, SEND-OUT TO REF LAB; TAT 18-24 HRS): SARS-CoV-2, NAA: DETECTED — AB

## 2019-09-21 ENCOUNTER — Telehealth (HOSPITAL_COMMUNITY): Payer: Self-pay | Admitting: Emergency Medicine

## 2019-09-21 NOTE — Telephone Encounter (Signed)
Your test for COVID-19 was positive, meaning that you were infected with the novel coronavirus and could give the germ to others.  Please continue isolation at home for at least 10 days since the start of your symptoms. If you do not have symptoms, please isolate at home for 10 days from the day you were tested. Once you complete your 10 day quarantine, you may return to normal activities as long as you've not had a fever for over 24 hours(without taking fever reducing medicine) and your symptoms are improving. Please continue good preventive care measures, including:  frequent hand-washing, avoid touching your face, cover coughs/sneezes, stay out of crowds and keep a 6 foot distance from others.  Go to the nearest hospital emergency room if fever/cough/breathlessness are severe or illness seems like a threat to life.  Pt  contacted and made aware with arabic interpreter. Will end quarantine on dec 14th. All questions answered.

## 2019-10-27 ENCOUNTER — Telehealth: Payer: Self-pay | Admitting: Family Medicine

## 2019-10-27 NOTE — Telephone Encounter (Signed)
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  TB record has been closed.  This note was signed on October 21, 2019.  Will scan this document into patient's chart as well to have record that he completed latent TB treatment.  Orpah Clinton, PGY-3 [facility] 10/27/2019 8:45 AM

## 2019-11-12 ENCOUNTER — Telehealth: Payer: Self-pay | Admitting: *Deleted

## 2019-11-12 NOTE — Telephone Encounter (Signed)
Pt dentist dr Clabe Seal calling wanting to talk to pt pcp about his TB and if he is cleared to get dental work. You can call her at [phone]. Jenee Spaugh Bruna Potter, CMA

## 2019-11-12 NOTE — Telephone Encounter (Signed)
Attempted to call but office was closed. Pleas call and inform them that he is cleared to get dental cleaning. I had sent in a clearance sheet 2 weeks ago regarding this. Patient has completed TB treatment so he can go ahead and get dental cleaning.   Orpah Clinton, PGY-3 [facility] 11/12/2019 7:41 PM

## 2019-11-16 NOTE — Telephone Encounter (Signed)
Attempted to call patient using [employer] ID # [phone].  There was not answer and no ability to leave a message.  Called the dental office of Dr. Clabe Seal and left a message informing them that the patient has been cleared for dental work per Dr. Darin Engels.  .Glennie Hawk, CMA

## 2019-11-23 ENCOUNTER — Ambulatory Visit (INDEPENDENT_AMBULATORY_CARE_PROVIDER_SITE_OTHER): Payer: Medicaid Other | Admitting: Family Medicine

## 2019-11-23 ENCOUNTER — Other Ambulatory Visit: Payer: Self-pay

## 2019-11-23 DIAGNOSIS — Z23 Encounter for immunization: Secondary | ICD-10-CM

## 2019-11-23 DIAGNOSIS — Z0289 Encounter for other administrative examinations: Secondary | ICD-10-CM

## 2019-11-23 DIAGNOSIS — Z720 Tobacco use: Secondary | ICD-10-CM

## 2019-11-23 MED ORDER — NICOTINE POLACRILEX 2 MG MT GUM: 2.0000 mg | CHEWING_GUM | OROMUCOSAL | 3 refills | Status: DC | PRN

## 2019-11-23 MED FILL — SM NICOTINE 2 MG CHEWING GU: 2 | 4 days supply | Qty: 110 | Fill #0

## 2019-11-23 NOTE — Progress Notes (Signed)
CHIEF COMPLAINT / HPI:  Desire for vaccines Patient presenting today for desire for vaccines.  Needs to have updated vaccinations to in order to obtain green card.  Hepatitis B series, hepatitis A vaccine, pneumonia vaccine, Tdap required.  Did receive some vaccinations at the health department including Tdap.  Tobacco abuse Patient does report continued cigarette smoking.  Smokes 10 cigarettes a day.  Is interested in quitting and would like nicotine gum if possible.  PERTINENT  PMH / PSH: refuge, sickle cell trait, tobacco abuse   OBJECTIVE: BP 100/60   Pulse 80   Wt 150 lb 12.8 oz (68.4 kg)   SpO2 100%   BMI 20.17 kg/m   Gen: awake and alert, NAD Cardio: RRR, no MRG Resp: CTAB, no wheezes, rales, or rhonchi GI: soft, non tender, non distended, bowel sounds present Ext: no edema   ASSESSMENT / PLAN:  Encounter for health examination of refugee NCI are reviewed by both me and CMA.  Patient will need hepatitis B vaccine, hepatitis A vaccine, MMR to complete his vaccination series.  Other vaccinations were obtained in the health department.  We were able to complete his vaccination schedule here in the clinic today.  Vaccination records handed to patient.  I discussed with his caseworker Doha over the telephone who will help patient obtain green card.  She advised that we give copy of vaccinations to patient and she will take care of the rest.  Patient will call with any questions.  Tobacco abuse Graduated patient on desire to quit.  We were able to send Rx for nicotine gum to [facility].  This was faxed over by front desk staff.  Patient will walk over to pick up that supply.  I gave directions for [facility].     Sherin Abraham, DO  PGY-3 [facility] 

## 2019-11-23 NOTE — Patient Instructions (Signed)
It was a pleasure seeing you today.   Today we discussed tobacco cessation and vaccines  For your tobacco cessation: [facility] The front desk will show you how to get there if you need help. Please walk over and pick it up   Please follow up if you have concerns or sooner if symptoms persist or worsen. Please call the clinic immediately if you have any concerns.   Our clinic's number is [phone]. Please call with questions or concerns.   Thank you,  Oralia Manis, DO

## 2019-11-24 NOTE — Assessment & Plan Note (Signed)
NCI are reviewed by both me and CMA.  Patient will need hepatitis B vaccine, hepatitis A vaccine, MMR to complete his vaccination series.  Other vaccinations were obtained in the health department.  We were able to complete his vaccination schedule here in the clinic today.  Vaccination records handed to patient.  I discussed with his caseworker Patrecia Pace over the telephone who will help patient obtain green card.  She advised that we give copy of vaccinations to patient and she will take care of the rest.  Patient will call with any questions.

## 2019-11-24 NOTE — Assessment & Plan Note (Signed)
Graduated patient on desire to quit.  We were able to send Rx for nicotine gum to [facility] outpatient pharmacy.  This was faxed over by front desk staff.  Patient will walk over to pick up that supply.  I gave directions for [facility] outpatient pharmacy.

## 2020-03-25 DIAGNOSIS — Z7189 Other specified counseling: Secondary | ICD-10-CM | POA: Diagnosis not present

## 2020-03-25 DIAGNOSIS — Z23 Encounter for immunization: Secondary | ICD-10-CM | POA: Diagnosis not present

## 2020-07-08 ENCOUNTER — Encounter: Payer: Self-pay | Admitting: Family Medicine

## 2020-07-08 ENCOUNTER — Other Ambulatory Visit: Payer: Self-pay

## 2020-07-08 ENCOUNTER — Ambulatory Visit (INDEPENDENT_AMBULATORY_CARE_PROVIDER_SITE_OTHER): Payer: Medicaid Other | Admitting: Family Medicine

## 2020-07-08 VITALS — BP 102/58 | HR 78 | Ht 77.0 in | Wt 145.0 lb

## 2020-07-08 DIAGNOSIS — L84 Corns and callosities: Secondary | ICD-10-CM | POA: Diagnosis not present

## 2020-07-08 DIAGNOSIS — Z720 Tobacco use: Secondary | ICD-10-CM

## 2020-07-08 NOTE — Patient Instructions (Addendum)
It was wonderful to see you today.  Please bring ALL of your medications with you to every visit.   Today we talked about:  Your feet calluses.  You will probably require a more cushioned insole.  Please consider trying Dr. Margart Sickles gel soles.  This can be picked up at most local pharmacy stores or [facility].  I have referred you to a podiatrist.  You should get a call to schedule an appointment in 1 to 2 weeks.  Please let us know if you do not hear from them.   Also in the meantime you can try Tylenol or ibuprofen for relief.  Please call the clinic at [phone] if your symptoms worsen or you have any concerns. It was our pleasure to serve you.  Dr. Salvadore Dom

## 2020-07-08 NOTE — Progress Notes (Signed)
    SUBJECTIVE:   CHIEF COMPLAINT / HPI:   Robert Nunez is a 34 yo M who presents for the below.   Feet hurt Going on for 2 years.  Endorses that he stands all day at work and works 8 to 12 hours/day.  Has foot pain.  Denies fever.  Has sought care here 3 times.  Endorses that freezing the last time did not help.  Is amendable to seeing a foot specialist.  Smoking cessation No longer current smoker.  No longer using nicotine gum.  PERTINENT  PMH / PSH:   OBJECTIVE:   Pulse 78   Wt 145 lb (65.8 kg)   SpO2 99%   BMI 19.40 kg/m   General: Appears well, no acute distress. Age appropriate. Extremities: Bilateral plantar calluses. Fluctuant. Non erythematous. No drainage. Tender to palpation.   Media Information   Document Information  Photos    07/08/2020 13:51  Attached To:  Office Visit on 07/08/20 with Autry-Lott, Randa Evens, DO  Source Information  Autry-Lott, Randa Evens, DO  [facility]    ASSESSMENT/PLAN:   Callus of foot Chronic.  Stable.  Bilateral fluctuant callus-like sores on plantar surface.  No systemic symptoms.  Likely due to standing for long periods without proper support of shoes. -Can use Tylenol or ibuprofen for pain relief -Consider Dr. Margart Sickles gel inserts -Referral to podiatry for possible customary inserts and other discretionary management  Tobacco abuse Resolved. Does not smoke.    Lavonda Jumbo, DO [facility]

## 2020-07-08 NOTE — Assessment & Plan Note (Signed)
Chronic.  Stable.  Bilateral fluctuant callus-like sores on plantar surface.  No systemic symptoms.  Likely due to standing for long periods without proper support of shoes. -Can use Tylenol or ibuprofen for pain relief -Consider Dr. Margart Sickles gel inserts -Referral to podiatry for possible customary inserts and other discretionary management

## 2020-07-08 NOTE — Assessment & Plan Note (Signed)
Resolved. Does not smoke.

## 2020-08-19 ENCOUNTER — Ambulatory Visit: Payer: Medicaid Other | Admitting: Podiatry

## 2020-09-02 ENCOUNTER — Ambulatory Visit: Payer: Medicaid Other | Admitting: Podiatry

## 2020-09-02 ENCOUNTER — Other Ambulatory Visit: Payer: Self-pay

## 2020-09-02 DIAGNOSIS — M7752 Other enthesopathy of left foot: Secondary | ICD-10-CM | POA: Diagnosis not present

## 2020-09-02 DIAGNOSIS — Q828 Other specified congenital malformations of skin: Secondary | ICD-10-CM | POA: Diagnosis not present

## 2020-09-06 ENCOUNTER — Encounter: Payer: Self-pay | Admitting: Podiatry

## 2020-09-06 NOTE — Progress Notes (Signed)
  Subjective:  Patient ID: Robert Nunez, male    DOB: 15-May-1986,  MRN: [id number]  Chief Complaint  Patient presents with  . Callouses    Bilateral callouses painful     34 y.o. male presents with the above complaint.  Patient presents with complaint of left submetatarsal 5 benign skin lesion/porokeratosis.  Patient states painful to walk on.  Patient would like to have them debrided down to discuss treatment options for it.  He has not seen anyone else prior to seeing me.  He would like to discuss many options that are available.  He states his pain is 10 out of 10 when ambulating and hitting a certain spot on the lateral aspect of his foot.   Review of Systems: Negative except as noted in the HPI. Denies N/V/F/Ch.  Past Medical History:  Diagnosis Date  . Sickle cell trait (HCC)    No current outpatient medications on file.  Social History   Tobacco Use  Smoking Status Current Every Day Smoker  Smokeless Tobacco Never Used  Tobacco Comment   15 cig/day    No Known Allergies Objective:  There were no vitals filed for this visit. There is no height or weight on file to calculate BMI. Constitutional Well developed. Well nourished.  Vascular Dorsalis pedis pulses palpable bilaterally. Posterior tibial pulses palpable bilaterally. Capillary refill normal to all digits.  No cyanosis or clubbing noted. Pedal hair growth normal.  Neurologic Normal speech. Oriented to person, place, and time. Epicritic sensation to light touch grossly present bilaterally.  Dermatologic  hyperkeratotic lesion noted to the left submetatarsal 5 with central nucleated core.  Pain on palpation to the lesion.  No pinpoint bleeding noted.  No concern for underlying ulceration noted.  Pain with range of motion of the fifth metatarsophalangeal joint mild.  No deep intra-articular pain  Orthopedic: Normal joint ROM without pain or crepitus bilaterally. No visible deformities. No bony tenderness.    Radiographs: None Assessment:   1. Porokeratosis   2. Capsulitis of metatarsophalangeal (MTP) joint of left foot    Plan:  Patient was evaluated and treated and all questions answered.  Left submetatarsal 5 porokeratosis/benign skin lesion with underlying capsulitis -I explained patient the etiology of porokeratosis various treatment options were discussed.  Given the amount of pain that he is having I believe patient would benefit from a steroid injection to help decrease acute inflammatory component associate with pain.  Patient agrees with the plan and would like to proceed with a steroid injection. -I also debrided down the lesion to healthy striated tissue followed by excision of the core standard technique. No follow-ups on file.

## 2020-10-12 ENCOUNTER — Ambulatory Visit (INDEPENDENT_AMBULATORY_CARE_PROVIDER_SITE_OTHER): Payer: Medicaid Other | Admitting: Podiatry

## 2020-10-12 ENCOUNTER — Other Ambulatory Visit: Payer: Self-pay

## 2020-10-12 ENCOUNTER — Encounter: Payer: Self-pay | Admitting: Podiatry

## 2020-10-12 DIAGNOSIS — M7752 Other enthesopathy of left foot: Secondary | ICD-10-CM

## 2020-10-12 DIAGNOSIS — Q828 Other specified congenital malformations of skin: Secondary | ICD-10-CM

## 2020-10-12 NOTE — Progress Notes (Signed)
  Subjective:  Patient ID: Robert Nunez, male    DOB: 18-May-1986,  MRN: [id number]  Chief Complaint  Patient presents with  . Callouses    34 y.o. male presents with the above complaint.  Patient presents with follow-up of left submetatarsal 5 benign skin lesion/porokeratosis.  Patient states he is doing little better the injection helped a lot.  He would like to do another injection.  His pain has come back.  He denies any other acute complaints.   Review of Systems: Negative except as noted in the HPI. Denies N/V/F/Ch.  Past Medical History:  Diagnosis Date  . Sickle cell trait (HCC)    No current outpatient medications on file.  Social History   Tobacco Use  Smoking Status Current Every Day Smoker  Smokeless Tobacco Never Used  Tobacco Comment   15 cig/day    No Known Allergies Objective:  There were no vitals filed for this visit. There is no height or weight on file to calculate BMI. Constitutional Well developed. Well nourished.  Vascular Dorsalis pedis pulses palpable bilaterally. Posterior tibial pulses palpable bilaterally. Capillary refill normal to all digits.  No cyanosis or clubbing noted. Pedal hair growth normal.  Neurologic Normal speech. Oriented to person, place, and time. Epicritic sensation to light touch grossly present bilaterally.  Dermatologic  hyperkeratotic lesion noted to the left submetatarsal 5 with central nucleated core.  Pain on palpation to the lesion.  No pinpoint bleeding noted.  No concern for underlying ulceration noted.  Pain with range of motion of the fifth metatarsophalangeal joint mild.  No deep intra-articular pain  Orthopedic: Normal joint ROM without pain or crepitus bilaterally. No visible deformities. No bony tenderness.   Radiographs: None Assessment:   1. Porokeratosis   2. Capsulitis of metatarsophalangeal (MTP) joint of left foot    Plan:  Patient was evaluated and treated and all questions answered.  Left  submetatarsal 5 porokeratosis/benign skin lesion with underlying capsulitis -I explained patient the etiology of porokeratosis various treatment options were discussed.  Given the amount of pain that he is having I believe patient would benefit from a steroid injection to help decrease acute inflammatory component associate with pain.  Patient agrees with the plan and would like to proceed with a steroid injection. -A steroid injection was performed at left 5th metatarsophalangeal joint using 1% plain Lidocaine and 10 mg of Kenalog. This was well tolerated. -I also debrided down the lesion to healthy striated tissue followed by excision of the core standard technique. -If there is no improvement we will discuss surgical options during next clinical visit. No follow-ups on file.

## 2020-11-11 ENCOUNTER — Ambulatory Visit: Payer: Medicaid Other | Admitting: Podiatry

## 2020-11-23 ENCOUNTER — Encounter: Payer: Self-pay | Admitting: Podiatry

## 2020-11-23 ENCOUNTER — Ambulatory Visit (INDEPENDENT_AMBULATORY_CARE_PROVIDER_SITE_OTHER): Payer: Medicaid Other | Admitting: Podiatry

## 2020-11-23 ENCOUNTER — Other Ambulatory Visit: Payer: Self-pay

## 2020-11-23 DIAGNOSIS — Q828 Other specified congenital malformations of skin: Secondary | ICD-10-CM | POA: Diagnosis not present

## 2020-11-23 DIAGNOSIS — M7752 Other enthesopathy of left foot: Secondary | ICD-10-CM | POA: Diagnosis not present

## 2020-11-25 ENCOUNTER — Encounter: Payer: Self-pay | Admitting: Podiatry

## 2020-11-25 NOTE — Progress Notes (Signed)
  Subjective:  Patient ID: Robert Nunez, male    DOB: 1986/02/22,  MRN: [id number]  Chief Complaint  Patient presents with  . Callouses    Bilateral calluses     35 y.o. male presents with the above complaint.  Patient presents with follow-up of left submetatarsal 5 benign skin lesion/porokeratosis.  He states the injection helped a little bit.  He denies any other acute complaints he states that there is still painful.  Has not gotten any better   Review of Systems: Negative except as noted in the HPI. Denies N/V/F/Ch.  Past Medical History:  Diagnosis Date  . Sickle cell trait (HCC)    No current outpatient medications on file.  Social History   Tobacco Use  Smoking Status Current Every Day Smoker  Smokeless Tobacco Never Used  Tobacco Comment   15 cig/day    No Known Allergies Objective:  There were no vitals filed for this visit. There is no height or weight on file to calculate BMI. Constitutional Well developed. Well nourished.  Vascular Dorsalis pedis pulses palpable bilaterally. Posterior tibial pulses palpable bilaterally. Capillary refill normal to all digits.  No cyanosis or clubbing noted. Pedal hair growth normal.  Neurologic Normal speech. Oriented to person, place, and time. Epicritic sensation to light touch grossly present bilaterally.  Dermatologic  hyperkeratotic lesion noted to the left submetatarsal 5 with central nucleated core.  Pain on palpation to the lesion.  No pinpoint bleeding noted.  No concern for underlying ulceration noted.  Pain with range of motion of the fifth metatarsophalangeal joint mild.  No deep intra-articular pain  Orthopedic: Normal joint ROM without pain or crepitus bilaterally. No visible deformities. No bony tenderness.   Radiographs: None Assessment:   1. Porokeratosis   2. Capsulitis of metatarsophalangeal (MTP) joint of left foot    Plan:  Patient was evaluated and treated and all questions answered.  Left  submetatarsal 5 porokeratosis/benign skin lesion with underlying capsulitis -I explained patient the etiology of porokeratosis various treatment options were discussed.  . -I will hold off on any further injection for now.  As it helped him only a little bit. -I also debrided down the lesion to healthy striated tissue followed by excision of the core standard technique. -I discussed with him the surgical options in details.  For now patient would like to discuss think about surgical options. No follow-ups on file.

## 2021-01-04 ENCOUNTER — Ambulatory Visit (INDEPENDENT_AMBULATORY_CARE_PROVIDER_SITE_OTHER): Payer: Medicaid Other

## 2021-01-04 ENCOUNTER — Other Ambulatory Visit: Payer: Self-pay

## 2021-01-04 ENCOUNTER — Ambulatory Visit (INDEPENDENT_AMBULATORY_CARE_PROVIDER_SITE_OTHER): Payer: Medicaid Other | Admitting: Podiatry

## 2021-01-04 DIAGNOSIS — M7751 Other enthesopathy of right foot: Secondary | ICD-10-CM | POA: Diagnosis not present

## 2021-01-04 DIAGNOSIS — M7752 Other enthesopathy of left foot: Secondary | ICD-10-CM

## 2021-01-04 DIAGNOSIS — Z01818 Encounter for other preprocedural examination: Secondary | ICD-10-CM | POA: Diagnosis not present

## 2021-01-04 DIAGNOSIS — M21622 Bunionette of left foot: Secondary | ICD-10-CM

## 2021-01-06 ENCOUNTER — Encounter: Payer: Self-pay | Admitting: Podiatry

## 2021-01-06 NOTE — Progress Notes (Signed)
Subjective:  Patient ID: Robert Nunez, male    DOB: Mar 13, 1986,  MRN: [id number]  Chief Complaint  Patient presents with  . Callouses    Pt is interested in surgery     35 y.o. male presents with the above complaint.  Patient presents with follow-up of left submetatarsal 5 benign skin lesion/porokeratosis with underlying tailor's bunion.  Patient states is very painful to touch.  He would like to get the surgery done.  He states he has been doing this for quite some time has not gotten any better.  The injections have helped temporarily but has not done any better.  He is made shoe gear modification none of which has helped.  He is interested in getting surgery.   Review of Systems: Negative except as noted in the HPI. Denies N/V/F/Ch.  Past Medical History:  Diagnosis Date  . Sickle cell trait (HCC)    No current outpatient medications on file.  Social History   Tobacco Use  Smoking Status Current Every Day Smoker  Smokeless Tobacco Never Used  Tobacco Comment   15 cig/day    No Known Allergies Objective:  There were no vitals filed for this visit. There is no height or weight on file to calculate BMI. Constitutional Well developed. Well nourished.  Vascular Dorsalis pedis pulses palpable bilaterally. Posterior tibial pulses palpable bilaterally. Capillary refill normal to all digits.  No cyanosis or clubbing noted. Pedal hair growth normal.  Neurologic Normal speech. Oriented to person, place, and time. Epicritic sensation to light touch grossly present bilaterally.  Dermatologic  hyperkeratotic lesion noted to the left submetatarsal 5 with central nucleated core.  Pain on palpation to the lesion.  No pinpoint bleeding noted.  No concern for underlying ulceration noted.  Pain with range of motion of the fifth metatarsophalangeal joint mild.  No deep intra-articular pain.  Pain on palpation to the left lateral tailor's bunion.  The deformity is not reducible.  There  is a plantarflexed metatarsal component to it as well.  Orthopedic: Normal joint ROM without pain or crepitus bilaterally. No visible deformities. No bony tenderness.   Radiographs: None Assessment:   1. Capsulitis of metatarsophalangeal (MTP) joint of left foot   2. Tailor's bunion of left foot   3. Preoperative examination    Plan:  Patient was evaluated and treated and all questions answered.  Left submetatarsal 5 porokeratosis with underlying tailor's bunion and plantarflexed metatarsal -I explained the patient the etiology of tailor's bunion versus treatment options were discussed.  Given that is putting excessive plantar flexor E force to the submetatarsal 5 likely patient will benefit from floating osteotomy of the left fifth metatarsal to take the pressure off of it and allow the foot itself to find the ideal position and heal.  I discussed with my patient the surgical plan in extensive detail.  He states understanding would like to proceed with the surgery.  He has failed all conservative treatment options including shoe gear modification and multiple injections and offloading. -He will be weightbearing as tolerated in surgical shoe after the surgery. -Informed surgical risk consent was reviewed and read aloud to the patient.  I reviewed the films.  I have discussed my findings with the patient in great detail.  I have discussed all risks including but not limited to infection, stiffness, scarring, limp, disability, deformity, damage to blood vessels and nerves, numbness, poor healing, need for braces, arthritis, chronic pain, amputation, death.  All benefits and realistic expectations discussed in great detail.  I have made no promises as to the outcome.  I have provided realistic expectations.  I have offered the patient a 2nd opinion, which they have declined and assured me they preferred to proceed despite the risks -A total of 33 minutes was spent in direct patient care as well as  pre and post patient encounter activities.  This includes documentation as well as reviewing patient chart for labs, imaging, past medical, surgical, social, and family history as documented in the EMR.  I have reviewed medication allergies as documented in EMR.  I discussed the etiology of condition and treatment options from conservative to surgical care.  All risks and benefit of the treatment course was discussed in detail.  All questions were answered and return appointment was discussed.  Since the visit completed in an ambulatory/outpatient setting, the patient and/or parent/guardian has been advised to contact the providers office for worsening condition and seek medical treatment and/or call 911 if the patient deems either is necessary.  No follow-ups on file.

## 2021-02-01 ENCOUNTER — Ambulatory Visit: Payer: Medicaid Other | Admitting: Family Medicine

## 2021-02-05 NOTE — Progress Notes (Addendum)
Patient Name: Robert Nunez Date of Birth: 1985/10/24 Date of Visit: 02/08/21 PCP: Lavonda Jumbo, DO  Chief Complaint: preoperative evaluation Surgeon:  Dr. Nicholes Rough Surgery: 5th Left toe bunionectomy  Date of Procedure: 02/27/21  Subjective: Robert Nunez is a pleasant 35 y.o. 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   The patient can walk up 2 flights of stairs without difficulty. Endorses ability to walk up to 2 hours without symptoms. Denies any exertional chest pain or SOB.  Prior surgeries: None Difficulty with surgical procedures in the past: N/A History of difficulty with anesthesia: None. Never had anesthesia History of venous thromboembolic disease: No History of easy bleeding with procedures: no  Family history of venous thromboembolic disease:no  Family history of bleeding diathesis:no  Medical History: Cardiac conditions? no History of VTE? No History of adverse surgical outcome? No Diabetes? no Obesity? no OSA? no  Latent TB: s/p 3 month TB treatment course per [facility].  Social History: Tobacco: no Alcohol: no  Illicit drugs: no  Allergies: none  Medications: none  ROS:  Review of Systems  Constitutional: Negative for chills and fever.  HENT: Negative for sore throat.   Eyes: Negative for blurred vision and double vision.  Respiratory: Negative for cough and shortness of breath.   Cardiovascular: Negative for chest pain.  Gastrointestinal: Negative for abdominal pain, constipation, diarrhea, nausea and vomiting.  Genitourinary: Negative for dysuria, frequency, hematuria and urgency.  Neurological: Negative for dizziness, seizures, loss of consciousness, weakness and headaches.   Chest pain with exertion? No Dyspnea on exertion? No Can you walk 2 blocks  without dyspnea or chest pain? Yes Can you walk up a flight of stairs without chest pain or dyspnea? Yes Any history of easy bruising or bleeding? No Any family history of bleeding diathesis? No Any personal or family of difficulty with anesthesia? No Any history of sleep apnea? No  Snore? No  I have reviewed the patient's medical, surgical, family, and social history as appropriate.   Vitals:   02/08/21 1408  BP: 112/64  Pulse: (!) 56  SpO2: 99%   Filed Weights   02/08/21 1408  Weight: 140 lb 6.4 oz (63.7 kg)   General: pleasant tall, very thin african [place] male, sitting comfortably in exam chair, well nourished, well developed, in no acute distress with non-toxic appearance HEENT: normocephalic, atraumatic, moist mucous membranes, oropharynx clear without erythema or exudate, no tonsillar hypertrophy, TM normal bilaterally  Neck: supple, non-tender without lymphadenopathy, no thyromegaly CV: regular rate and rhythm without murmurs, rubs, or gallops, no murmur with valsalva maneuver, no lower extremity edema, 2+ radial and pedal pulses bilaterally Lungs: clear to auscultation bilaterally with normal work of breathing on room air, speaking in full sentences Abdomen: soft, non-tender, non-distended, no masses or organomegaly palpable, normoactive bowel sounds Skin: warm, dry Extremities: warm and well perfused, normal tone MSK: strength intact, gait normal Neuro: Alert and oriented, speech normal  EKG: sinus bradycardia, HR 53, early repolarization   Mandel was seen today for pre-op evaluation.  Diagnoses and all orders for this visit:  Latent TB: CXR in 05/2019 negative. Per [facility], has completed 3 month course of treatment  Tobacco User: Current everyday smoker of ~5 cigs/day. Was counseled on importance of cessation. He should try to avoid cigarette smoking for 1-2 weeks prior to surgical procedure.  Preoperative clearance Patient is low risk for  a low risk surgery.  [name] calculator places her at very low risk for perioperative CV event. He is a everyday smoker and was counseled on importance of cessation and avoiding cigarette smoking for 1-2 weeks prior to surgical procedure. EKG obtained and reveals sinus bradycardia and early repolarization suspect normal variation in asymptomatic, tall, thin african [place] male. He has no prior lab abnormalities. Pending final EKG read, it is under my medical opinion patient is medically optimized for surgery.   Due to language barrier, an interpreter was present during the history-taking and subsequent discussion (and for part of the physical exam) with this patient. Arabic (May) #[id number]  Orpah Cobb, DO [facility], PGY3 02/08/2021 2:46 PM

## 2021-02-08 ENCOUNTER — Encounter: Payer: Self-pay | Admitting: Family Medicine

## 2021-02-08 ENCOUNTER — Telehealth: Payer: Self-pay | Admitting: Family Medicine

## 2021-02-08 ENCOUNTER — Other Ambulatory Visit: Payer: Self-pay

## 2021-02-08 ENCOUNTER — Ambulatory Visit: Payer: Medicaid Other | Admitting: Family Medicine

## 2021-02-08 ENCOUNTER — Ambulatory Visit (HOSPITAL_COMMUNITY)
Admission: RE | Admit: 2021-02-08 | Discharge: 2021-02-08 | Disposition: A | Discharge status: Home / Self Care | Payer: Medicaid Other | Source: Ambulatory Visit | Attending: Family Medicine | Admitting: Family Medicine

## 2021-02-08 VITALS — BP 112/64 | HR 56 | Ht 72.5 in | Wt 140.4 lb

## 2021-02-08 DIAGNOSIS — Z01818 Encounter for other preprocedural examination: Secondary | ICD-10-CM

## 2021-02-08 DIAGNOSIS — Z01810 Encounter for preprocedural cardiovascular examination: Secondary | ICD-10-CM | POA: Insufficient documentation

## 2021-02-08 DIAGNOSIS — R7612 Nonspecific reaction to cell mediated immunity measurement of gamma interferon antigen response without active tuberculosis: Secondary | ICD-10-CM

## 2021-02-08 NOTE — Assessment & Plan Note (Signed)
CXR in 05/2019 negative. Per [facility], has completed 3 month course of treatment

## 2021-02-09 ENCOUNTER — Telehealth: Payer: Self-pay | Admitting: Urology

## 2021-02-09 NOTE — Telephone Encounter (Signed)
DOS - 02/27/21  Delos Haring LEFT --- [phone]  HEALTHY BLUE MEDICAID EFFECTIVE DATE - 04/14/20  SPOKE WITH COURTNEY WITH [employer]ICA[employer] AND SHE STATED THAT FOR CPT CODE 11552.  REF # I - [id number]

## 2021-02-10 NOTE — Telephone Encounter (Signed)
error 

## 2021-03-08 ENCOUNTER — Encounter: Payer: Medicaid Other | Admitting: Podiatry

## 2021-03-16 ENCOUNTER — Encounter: Payer: Self-pay | Admitting: Family Medicine

## 2021-03-22 ENCOUNTER — Encounter: Payer: Medicaid Other | Admitting: Podiatry

## 2021-04-12 ENCOUNTER — Telehealth: Payer: Self-pay | Admitting: Urology

## 2021-04-12 NOTE — Telephone Encounter (Signed)
DOS - 05/08/21  TAILORS BUNIONECTOMY 5TH LEFT --- [phone]   HEALTHY BLUE EFFECTIVE -04/14/20   PER [employer] SIT NO PRIOR AUTH IS REQUIRED FOR CPT CODE 19417. REF # [id number]

## 2021-05-02 ENCOUNTER — Other Ambulatory Visit: Payer: Self-pay

## 2021-05-02 ENCOUNTER — Encounter (HOSPITAL_BASED_OUTPATIENT_CLINIC_OR_DEPARTMENT_OTHER): Payer: Self-pay | Admitting: Podiatry

## 2021-05-02 NOTE — Progress Notes (Addendum)
ADDENDUM:  Received pt's pcp, Dr Neita Garnet, H&P dated 05-05-2021 via fax from Dr Allena Katz office, placed w/ chart.   Spoke w/ via phone for pre-op interview--- Pt thru arabic [place] interpreter [phone] Lab needs dos----  no(per anes)/  pre-op orders pending             Lab results------ no COVID test -----patient states asymptomatic no test needed Arrive at ------- 0530 on 05-08-2021 NPO after MN NO Solid Food.  Clear liquids from MN until--- 0430 Med rec completed Medications to take morning of surgery ----- NONE Diabetic medication ----- n/a Patient instructed no nail polish to be worn day of surgery Patient instructed to bring photo id and insurance card day of surgery Patient aware to have Driver (ride ) / caregiver for 24 hours after surgery --wife, Deborha Payment Patient Special Instructions ----- n/a Pre-Op special Istructions ----- requested [place]ese arabic interpreter via email to [place] interpreting (copy of email w/ chart) Patient verbalized understanding of instructions that were given at this phone interview. Patient denies shortness of breath, chest pain, fever, cough at this phone interview.

## 2021-05-04 NOTE — H&P (View-Only) (Signed)
    SUBJECTIVE:   CHIEF COMPLAINT / HPI: pre-operative assessment   Patient presents for preoperative assessment in preparation for bunionectomy of his left foot. Patient reports he has no current complaints or issues at this time.  Patient denies any shortness of breath, dizziness/lightheadedness, no chest pain denies heart palpitations, vomiting, nausea, diarrhea or any generalized GI upset.  He reports that he works 12-hour shifts most days of the week and exercises by playing soccer.  He denies difficulty breathing while exercising. Review of patient's medical history shows no history of cardiac disease, congestive heart failure, cerebrovascular disease or elevated creatinine nor treatment with insulin.  Patient is recommended to proceed with planned procedure for removal of bunion on 05/08/21.  PERTINENT  PMH / PSH:  Tension headache Sick cell trait History of positive quant  gold, s/p treatment   OBJECTIVE:   BP 101/70   Pulse 89   Wt 140 lb 6.4 oz (63.7 kg)   SpO2 100%   BMI 18.52 kg/m   General: male appearing stated age in no acute distress HEENT: MMM, no oral lesions noted,Neck non-tender without lymphadenopathy, masses or thyromegaly Cardio: Normal S1 and S2, no S3 or S4. Rhythm is regular. No murmurs or rubs.  Bilateral radial pulses palpable, no murmurs appreciated with squatting, standing or seated cardiac auscultation Pulm: Clear to auscultation bilaterally, no crackles, wheezing, or diminished breath sounds. Normal respiratory effort, stable on room air Abdomen: Bowel sounds normal. Abdomen soft and non-tender. Extremities: No peripheral edema. Warm/ well perfused. Neuro: pt alert and oriented x4    ASSESSMENT/PLAN:   Preoperative general physical examination Patient is without history of cardiac disease, cerebrovascular disease, no abnormal creatinine and no treatment with insulin.  Revised cardiac risk index with class I risk, 3.9% of 30-day risk of death, MI or  cardiac arrest.  Patient is considered low risk and may proceed with surgery. Preoperative H&P form completed for patient's plan bunionectomy   F/u as needed  Ronnald Ramp, MD [facility]

## 2021-05-04 NOTE — Patient Instructions (Signed)
It was a pleasure to see you today!  Thank you for choosing [facility] for your primary care.   Ken I Sohail was seen for preoperative assessment.   Our plans for today were: I have completed the paperwork and will have the fax immediately to your surgeon in preparation for your surgery on 7/25. Based on lack of symptoms in your ability to exercise normally as well as your physical exam today, you are okay to proceed with surgery as scheduled. I have attached a copy of the paperwork as requested  You should return to our clinic as needed.   Best Wishes,   Dr. Neita Garnet

## 2021-05-04 NOTE — Progress Notes (Signed)
    SUBJECTIVE:   CHIEF COMPLAINT / HPI: pre-operative assessment   Patient presents for preoperative assessment in preparation for bunionectomy of his left foot. Patient reports he has no current complaints or issues at this time.  Patient denies any shortness of breath, dizziness/lightheadedness, no chest pain denies heart palpitations, vomiting, nausea, diarrhea or any generalized GI upset.  He reports that he works 12-hour shifts most days of the week and exercises by playing soccer.  He denies difficulty breathing while exercising. Review of patient's medical history shows no history of cardiac disease, congestive heart failure, cerebrovascular disease or elevated creatinine nor treatment with insulin.  Patient is recommended to proceed with planned procedure for removal of bunion on 05/08/21.  PERTINENT  PMH / PSH:  Tension headache Sick cell trait History of positive quant  gold, s/p treatment   OBJECTIVE:   BP 101/70   Pulse 89   Wt 140 lb 6.4 oz (63.7 kg)   SpO2 100%   BMI 18.52 kg/m   General: male appearing stated age in no acute distress HEENT: MMM, no oral lesions noted,Neck non-tender without lymphadenopathy, masses or thyromegaly Cardio: Normal S1 and S2, no S3 or S4. Rhythm is regular. No murmurs or rubs.  Bilateral radial pulses palpable, no murmurs appreciated with squatting, standing or seated cardiac auscultation Pulm: Clear to auscultation bilaterally, no crackles, wheezing, or diminished breath sounds. Normal respiratory effort, stable on room air Abdomen: Bowel sounds normal. Abdomen soft and non-tender. Extremities: No peripheral edema. Warm/ well perfused. Neuro: pt alert and oriented x4    ASSESSMENT/PLAN:   Preoperative general physical examination Patient is without history of cardiac disease, cerebrovascular disease, no abnormal creatinine and no treatment with insulin.  Revised cardiac risk index with class I risk, 3.9% of 30-day risk of death, MI or  cardiac arrest.  Patient is considered low risk and may proceed with surgery. Preoperative H&P form completed for patient's plan bunionectomy   F/u as needed  Hatcher Froning Simmons-Robinson, MD [facility]   

## 2021-05-05 ENCOUNTER — Ambulatory Visit: Payer: Medicaid Other | Admitting: Family Medicine

## 2021-05-05 ENCOUNTER — Other Ambulatory Visit: Payer: Self-pay

## 2021-05-05 DIAGNOSIS — Z01818 Encounter for other preprocedural examination: Secondary | ICD-10-CM | POA: Diagnosis not present

## 2021-05-05 NOTE — Assessment & Plan Note (Signed)
Patient is without history of cardiac disease, cerebrovascular disease, no abnormal creatinine and no treatment with insulin.  Revised cardiac risk index with class I risk, 3.9% of 30-day risk of death, MI or cardiac arrest.  Patient is considered low risk and may proceed with surgery. Preoperative H&P form completed for patient's plan bunionectomy

## 2021-05-07 NOTE — Anesthesia Preprocedure Evaluation (Addendum)
Anesthesia Evaluation  Patient identified by MRN, date of birth, ID band Patient awake    Reviewed: Allergy & Precautions, NPO status , Patient's Chart, lab work & pertinent test results  Airway Mallampati: I  TM Distance: >3 FB Neck ROM: Full    Dental no notable dental hx. (+) Teeth Intact, Dental Advisory Given   Pulmonary Current Smoker and Patient abstained from smoking.,    Pulmonary exam normal breath sounds clear to auscultation       Cardiovascular Exercise Tolerance: Good negative cardio ROS Normal cardiovascular exam Rhythm:Regular Rate:Normal     Neuro/Psych  Headaches, negative psych ROS   GI/Hepatic negative GI ROS, Neg liver ROS,   Endo/Other  negative endocrine ROS  Renal/GU      Musculoskeletal negative musculoskeletal ROS (+)   Abdominal   Peds  Hematology   Anesthesia Other Findings   Reproductive/Obstetrics                            Anesthesia Physical Anesthesia Plan  ASA: 2  Anesthesia Plan: MAC   Post-op Pain Management:  Regional for Post-op pain   Induction:   PONV Risk Score and Plan: 1 and Treatment may vary due to age or medical condition, Ondansetron and Midazolam  Airway Management Planned: Natural Airway  Additional Equipment: None  Intra-op Plan:   Post-operative Plan:   Informed Consent:     Dental advisory given  Plan Discussed with:   Anesthesia Plan Comments: (Mac   L popliteal Block)       Anesthesia Quick Evaluation

## 2021-05-08 ENCOUNTER — Ambulatory Visit (HOSPITAL_BASED_OUTPATIENT_CLINIC_OR_DEPARTMENT_OTHER)
Admission: RE | Admit: 2021-05-08 | Discharge: 2021-05-08 | Disposition: A | Discharge status: Home / Self Care | Payer: Medicaid Other | Source: Ambulatory Visit | Attending: Podiatry | Admitting: Podiatry

## 2021-05-08 ENCOUNTER — Ambulatory Visit (HOSPITAL_BASED_OUTPATIENT_CLINIC_OR_DEPARTMENT_OTHER): Payer: Medicaid Other | Admitting: Anesthesiology

## 2021-05-08 ENCOUNTER — Encounter (HOSPITAL_BASED_OUTPATIENT_CLINIC_OR_DEPARTMENT_OTHER)
Admission: RE | Disposition: A | Discharge status: Home / Self Care | Payer: Self-pay | Source: Ambulatory Visit | Attending: Podiatry

## 2021-05-08 ENCOUNTER — Other Ambulatory Visit: Payer: Self-pay | Admitting: Podiatry

## 2021-05-08 ENCOUNTER — Ambulatory Visit (HOSPITAL_COMMUNITY): Payer: Medicaid Other

## 2021-05-08 ENCOUNTER — Encounter (HOSPITAL_BASED_OUTPATIENT_CLINIC_OR_DEPARTMENT_OTHER): Payer: Self-pay | Admitting: Podiatry

## 2021-05-08 DIAGNOSIS — J329 Chronic sinusitis, unspecified: Secondary | ICD-10-CM | POA: Diagnosis not present

## 2021-05-08 DIAGNOSIS — D573 Sickle-cell trait: Secondary | ICD-10-CM | POA: Diagnosis not present

## 2021-05-08 DIAGNOSIS — F17200 Nicotine dependence, unspecified, uncomplicated: Secondary | ICD-10-CM | POA: Diagnosis not present

## 2021-05-08 DIAGNOSIS — Z9889 Other specified postprocedural states: Secondary | ICD-10-CM

## 2021-05-08 DIAGNOSIS — M21622 Bunionette of left foot: Secondary | ICD-10-CM | POA: Insufficient documentation

## 2021-05-08 DIAGNOSIS — B070 Plantar wart: Secondary | ICD-10-CM | POA: Diagnosis not present

## 2021-05-08 DIAGNOSIS — M2012 Hallux valgus (acquired), left foot: Secondary | ICD-10-CM | POA: Diagnosis not present

## 2021-05-08 HISTORY — PX: BUNIONECTOMY: SHX129

## 2021-05-08 SURGERY — BUNIONECTOMY
Anesthesia: Monitor Anesthesia Care | Site: Foot | Laterality: Left

## 2021-05-08 MED ORDER — LIDOCAINE HCL (PF) 2 % IJ SOLN
INTRAMUSCULAR | Status: AC
  Filled 2021-05-08: qty 5

## 2021-05-08 MED ORDER — OXYCODONE-ACETAMINOPHEN 5-325 MG PO TABS: 1.0000 | ORAL_TABLET | ORAL | 0 refills | Status: AC | PRN

## 2021-05-08 MED ORDER — LIDOCAINE HCL (CARDIAC) PF 100 MG/5ML IV SOSY
PREFILLED_SYRINGE | INTRAVENOUS | Status: DC | PRN
  Administered 2021-05-08: 60 mg via INTRAVENOUS

## 2021-05-08 MED ORDER — MIDAZOLAM HCL 5 MG/5ML IJ SOLN
INTRAMUSCULAR | Status: DC | PRN
  Administered 2021-05-08: 1 mg via INTRAVENOUS

## 2021-05-08 MED ORDER — MIDAZOLAM HCL 2 MG/2ML IJ SOLN
2.0000 mg | Freq: Once | INTRAMUSCULAR | Status: AC
  Administered 2021-05-08: 1 mg via INTRAVENOUS

## 2021-05-08 MED ORDER — ACETAMINOPHEN 10 MG/ML IV SOLN: 1000.0000 mg | Freq: Once | INTRAVENOUS | Status: DC | PRN

## 2021-05-08 MED ORDER — SODIUM CHLORIDE 0.9 % IR SOLN
Status: DC | PRN
  Administered 2021-05-08: 1000 mL

## 2021-05-08 MED ORDER — DEXAMETHASONE SODIUM PHOSPHATE 10 MG/ML IJ SOLN
INTRAMUSCULAR | Status: AC
  Filled 2021-05-08: qty 1

## 2021-05-08 MED ORDER — ONDANSETRON HCL 4 MG/2ML IJ SOLN
INTRAMUSCULAR | Status: AC
  Filled 2021-05-08: qty 2

## 2021-05-08 MED ORDER — HYDROMORPHONE HCL 1 MG/ML IJ SOLN: 0.2500 mg | INTRAMUSCULAR | Status: DC | PRN

## 2021-05-08 MED ORDER — FENTANYL CITRATE (PF) 100 MCG/2ML IJ SOLN
INTRAMUSCULAR | Status: DC | PRN
  Administered 2021-05-08 (×2): 12.5 ug via INTRAVENOUS
  Administered 2021-05-08: 25 ug via INTRAVENOUS

## 2021-05-08 MED ORDER — ARTIFICIAL TEARS OPHTHALMIC OINT
TOPICAL_OINTMENT | OPHTHALMIC | Status: AC
  Filled 2021-05-08: qty 3.5

## 2021-05-08 MED ORDER — OXYCODONE HCL 5 MG/5ML PO SOLN: 5.0000 mg | Freq: Once | ORAL | Status: DC | PRN

## 2021-05-08 MED ORDER — PROPOFOL 10 MG/ML IV BOLUS
INTRAVENOUS | Status: DC | PRN
  Administered 2021-05-08: 30 mg via INTRAVENOUS

## 2021-05-08 MED ORDER — SODIUM CHLORIDE 0.9 % IV SOLN
2.0000 g | Freq: Once | INTRAVENOUS | Status: AC
  Administered 2021-05-08: 2 g via INTRAVENOUS

## 2021-05-08 MED ORDER — IBUPROFEN 800 MG PO TABS: 800.0000 mg | ORAL_TABLET | Freq: Four times a day (QID) | ORAL | 1 refills | Status: AC | PRN

## 2021-05-08 MED ORDER — AMISULPRIDE (ANTIEMETIC) 5 MG/2ML IV SOLN: 10.0000 mg | Freq: Once | INTRAVENOUS | Status: DC | PRN

## 2021-05-08 MED ORDER — SODIUM CHLORIDE 0.9 % IV SOLN
INTRAVENOUS | Status: AC
  Filled 2021-05-08: qty 2

## 2021-05-08 MED ORDER — OXYCODONE HCL 5 MG PO TABS: 5.0000 mg | ORAL_TABLET | Freq: Once | ORAL | Status: DC | PRN

## 2021-05-08 MED ORDER — MIDAZOLAM HCL 2 MG/2ML IJ SOLN
INTRAMUSCULAR | Status: AC
  Filled 2021-05-08: qty 2

## 2021-05-08 MED ORDER — FENTANYL CITRATE (PF) 100 MCG/2ML IJ SOLN
100.0000 ug | Freq: Once | INTRAMUSCULAR | Status: AC
  Administered 2021-05-08: 50 ug via INTRAVENOUS

## 2021-05-08 MED ORDER — BUPIVACAINE HCL (PF) 0.5 % IJ SOLN
INTRAMUSCULAR | Status: DC | PRN
  Administered 2021-05-08: 22 mL via PERINEURAL

## 2021-05-08 MED ORDER — FENTANYL CITRATE (PF) 100 MCG/2ML IJ SOLN
INTRAMUSCULAR | Status: AC
  Filled 2021-05-08: qty 2

## 2021-05-08 MED ORDER — ONDANSETRON HCL 4 MG/2ML IJ SOLN
INTRAMUSCULAR | Status: DC | PRN
  Administered 2021-05-08: 4 mg via INTRAVENOUS

## 2021-05-08 MED ORDER — PROPOFOL 10 MG/ML IV BOLUS
INTRAVENOUS | Status: AC
  Filled 2021-05-08: qty 40

## 2021-05-08 MED ORDER — PROPOFOL 500 MG/50ML IV EMUL
INTRAVENOUS | Status: DC | PRN
  Administered 2021-05-08: 50 ug/kg/min via INTRAVENOUS

## 2021-05-08 MED ORDER — ONDANSETRON HCL 4 MG/2ML IJ SOLN
4.0000 mg | Freq: Once | INTRAMUSCULAR | Status: DC | PRN
Start: 2021-05-08 — End: 2021-05-08

## 2021-05-08 MED ORDER — LACTATED RINGERS IV SOLN: INTRAVENOUS | Status: DC

## 2021-05-08 SURGICAL SUPPLY — 60 items
BIT DRILL SHORT 1.7 316-0513 (DRILL) ×2 IMPLANT
BLADE AVERAGE 25X9 (BLADE) IMPLANT
BLADE OSC/SAG .038X5.5 CUT EDG (BLADE) ×2 IMPLANT
BLADE OSCILLATING/SAGITTAL (BLADE) ×1
BLADE SURG 15 STRL LF DISP TIS (BLADE) ×3 IMPLANT
BLADE SURG 15 STRL SS (BLADE) ×3
BLADE SW THK.38XMED LNG THN (BLADE) ×1 IMPLANT
BNDG ELASTIC 4X5.8 VLCR STR LF (GAUZE/BANDAGES/DRESSINGS) ×2 IMPLANT
BNDG ESMARK 4X9 LF (GAUZE/BANDAGES/DRESSINGS) ×2 IMPLANT
BNDG GAUZE ELAST 4 BULKY (GAUZE/BANDAGES/DRESSINGS) ×2 IMPLANT
BUR OVAL CARBIDE 4.0 (BURR) IMPLANT
CHLORAPREP W/TINT 26 (MISCELLANEOUS) ×2 IMPLANT
COVER BACK TABLE 60X90IN (DRAPES) ×2 IMPLANT
COVER MAYO STAND STRL (DRAPES) IMPLANT
CUFF TOURN SGL QUICK 24 (TOURNIQUET CUFF)
CUFF TOURN SGL QUICK 34 (TOURNIQUET CUFF)
CUFF TRNQT CYL 24X4X16.5-23 (TOURNIQUET CUFF) IMPLANT
CUFF TRNQT CYL 34X4.125X (TOURNIQUET CUFF) IMPLANT
DRAPE C-ARM 42X120 X-RAY (DRAPES) ×2 IMPLANT
DRAPE C-ARMOR (DRAPES) ×2 IMPLANT
DRAPE EXTREMITY T 121X128X90 (DISPOSABLE) ×2 IMPLANT
DRAPE IMP U-DRAPE 54X76 (DRAPES) ×2 IMPLANT
DRAPE U-SHAPE 47X51 STRL (DRAPES) ×2 IMPLANT
DRSG ADAPTIC 3X8 NADH LF (GAUZE/BANDAGES/DRESSINGS) IMPLANT
DRSG EMULSION OIL 3X3 NADH (GAUZE/BANDAGES/DRESSINGS) IMPLANT
DRSG PAD ABDOMINAL 8X10 ST (GAUZE/BANDAGES/DRESSINGS) IMPLANT
ELECT REM PT RETURN 9FT ADLT (ELECTROSURGICAL) ×2
ELECTRODE REM PT RTRN 9FT ADLT (ELECTROSURGICAL) ×1 IMPLANT
GAUZE 4X4 16PLY ~~LOC~~+RFID DBL (SPONGE) ×2 IMPLANT
GAUZE SPONGE 4X4 12PLY STRL (GAUZE/BANDAGES/DRESSINGS) ×2 IMPLANT
GAUZE SPONGE 4X4 12PLY STRL LF (GAUZE/BANDAGES/DRESSINGS) ×2 IMPLANT
GAUZE XEROFORM 1X8 LF (GAUZE/BANDAGES/DRESSINGS) ×2 IMPLANT
GLOVE SURG ENC MOIS LTX SZ7 (GLOVE) ×2 IMPLANT
GLOVE SURG UNDER POLY LF SZ7.5 (GLOVE) ×2 IMPLANT
GOWN STRL REUS W/TWL XL LVL3 (GOWN DISPOSABLE) ×2 IMPLANT
HANDLE RATCHET STRL (INSTRUMENTS) ×2 IMPLANT
K-WIRE 0.35X4 (WIRE) ×4
KIT TURNOVER CYSTO (KITS) ×2 IMPLANT
KWIRE 0.35X4 (WIRE) ×2 IMPLANT
NS IRRIG 1000ML POUR BTL (IV SOLUTION) IMPLANT
PACK BASIN DAY SURGERY FS (CUSTOM PROCEDURE TRAY) ×2 IMPLANT
PENCIL SMOKE EVACUATOR (MISCELLANEOUS) ×2 IMPLANT
RASP SM TEAR CROSS CUT (RASP) IMPLANT
SCREW CANN HEADED 2.0X14 (Screw) ×2 IMPLANT
SCREW CANN HEADED 2.0X16 (Screw) ×2 IMPLANT
SLEEVE SCD COMPRESS KNEE MED (STOCKING) ×2 IMPLANT
SPONGE T-LAP 4X18 ~~LOC~~+RFID (SPONGE) IMPLANT
STOCKINETTE 6  STRL (DRAPES) ×1
STOCKINETTE 6 STRL (DRAPES) ×1 IMPLANT
SUCTION FRAZIER HANDLE 10FR (MISCELLANEOUS) ×1
SUCTION TUBE FRAZIER 10FR DISP (MISCELLANEOUS) ×1 IMPLANT
SUT MNCRL AB 3-0 PS2 18 (SUTURE) ×2 IMPLANT
SUT MNCRL AB 4-0 PS2 18 (SUTURE) ×2 IMPLANT
SUT MON AB 5-0 PS2 18 (SUTURE) ×2 IMPLANT
SUT PROLENE 3 0 PS 2 (SUTURE) IMPLANT
SUT PROLENE 4 0 PS 2 18 (SUTURE) IMPLANT
SYR BULB EAR ULCER 3OZ GRN STR (SYRINGE) ×2 IMPLANT
TOWEL OR 17X26 10 PK STRL BLUE (TOWEL DISPOSABLE) ×2 IMPLANT
TUBE CONNECTING 12X1/4 (SUCTIONS) ×2 IMPLANT
UNDERPAD 30X36 HEAVY ABSORB (UNDERPADS AND DIAPERS) ×2 IMPLANT

## 2021-05-08 NOTE — Discharge Instructions (Addendum)
After Surgery Instructions   1) If you are recuperating from surgery anywhere other than home, please be sure to leave us the number where you can be reached.  2) Go directly home and rest.  3) Keep the operated foot(feet) elevated six inches above the hip when sitting or lying down. This will help control swelling and pain.  4) Support the elevated foot and leg with pillows. DO NOT PLACE PILLOWS UNDER THE KNEE.  5) DO NOT REMOVE or get your bandages WET, unless you were given different instructions by your doctor to do so. This increases the risk of infection.  6) Wear your surgical shoe or surgical boot at all times when you are up on your feet.  7) A limited amount of pain and swelling may occur. The skin may take on a bruised appearance. DO NOT BE ALARMED, THIS IS NORMAL.  8) For slight pain and swelling, apply an ice pack directly over the bandages for 15 minutes only out of each hour of the day. Continue until seen in the office for your first post op visit. DON NOT     APPLY ANY FORM OF HEAT TO THE AREA.  9) Have prescriptions filled immediately and take as directed.  10) Drink lots of liquids, water and juice to stay hydrated.  11) CALL IMMEDIATELY IF:  *Bleeding continues until the following day of surgery  *Pain increases and/or does not respond to medication  *Bandages or cast appears to tight  *If your bandage gets wet  *Trip, fall or stump your surgical foot  *If your temperature goes above 101  *If you have ANY questions at all  YOU NOW CONTROL THE EFFORT OF YOUR RECOVERY. ADHERING TO THESE INSTRUCTIONS WILL OFFER YOU THE MOST COMPLETE RESULTS   Post Anesthesia Home Care Instructions  Activity: Get plenty of rest for the remainder of the day. A responsible adult should stay with you for 24 hours following the procedure.  For the next 24 hours, DO NOT: -Drive a car -Operate machinery -Drink alcoholic beverages -Take any medication unless instructed by your  physician -Make any legal decisions or sign important papers.  Meals: Start with liquid foods such as gelatin or soup. Progress to regular foods as tolerated. Avoid greasy, spicy, heavy foods. If nausea and/or vomiting occur, drink only clear liquids until the nausea and/or vomiting subsides. Call your physician if vomiting continues.  Special Instructions/Symptoms: Your throat may feel dry or sore from the anesthesia or the breathing tube placed in your throat during surgery. If this causes discomfort, gargle with warm salt water. The discomfort should disappear within 24 hours.       Regional Anesthesia Blocks  1. Numbness or the inability to move the "blocked" extremity may last from 3-48 hours after placement. The length of time depends on the medication injected and your individual response to the medication. If the numbness is not going away after 48 hours, call your surgeon.  2. The extremity that is blocked will need to be protected until the numbness is gone and the  Strength has returned. Because you cannot feel it, you will need to take extra care to avoid injury. Because it may be weak, you may have difficulty moving it or using it. You may not know what position it is in without looking at it while the block is in effect.  3. For blocks in the legs and feet, returning to weight bearing and walking needs to be done carefully. You will need   to wait until the numbness is entirely gone and the strength has returned. You should be able to move your leg and foot normally before you try and bear weight or walk. You will need someone to be with you when you first try to ensure you do not fall and possibly risk injury.  4. Bruising and tenderness at the needle site are common side effects and will resolve in a few days.  5. Persistent numbness or new problems with movement should be communicated to the surgeon or the [facility] ([phone])/ [facility]  ([phone]). 

## 2021-05-08 NOTE — Anesthesia Procedure Notes (Signed)
Procedure Name: MAC Date/Time: 05/08/2021 7:38 AM Performed by: Mechele Claude, CRNA Pre-anesthesia Checklist: Patient identified, Emergency Drugs available, Suction available, Patient being monitored and Timeout performed Oxygen Delivery Method: Nasal cannula Placement Confirmation: positive ETCO2 and breath sounds checked- equal and bilateral

## 2021-05-08 NOTE — Op Note (Signed)
Surgeon: Surgeon(s): Candelaria Stagers, DPM  Assistants: None Pre-operative diagnosis: TAILORS BUNION  Post-operative diagnosis: same Procedure: Procedure(s) (LRB): TAILORS BUNIONECTOMY FIFTH TOE LEFT FOOT (Left)  Pathology: * No specimens in log *  Pertinent Intra-op findings: Left fifth tailor's bunion noted Anesthesia: Choice  Hemostasis:  Total Tourniquet Time Documented: Ankle (Left) - 28 minutes Total: Ankle (Left) - 28 minutes  EBL: Minimal Materials: 3-0, 4-0, 5-0 Monocryl and Acumed 2. 0 mm x 1 Injectables: None Complications: None  Indications for surgery: A 34 y.o. male presents with left tailor's bunion painful. Patient has failed all conservative therapy including but not limited to shoe gear modification and injection orthotics. He wishes to have surgical correction of the foot/deformity. It was determined that patient would benefit from left correction of tailor's bunion with screw fixation. Informed surgical risk consent was reviewed and read aloud to the patient.  I reviewed the films.  I have discussed my findings with the patient in great detail.  I have discussed all risks including but not limited to infection, stiffness, scarring, limp, disability, deformity, damage to blood vessels and nerves, numbness, poor healing, need for braces, arthritis, chronic pain, amputation, death.  All benefits and realistic expectations discussed in great detail.  I have made no promises as to the outcome.  I have provided realistic expectations.  I have offered the patient a 2nd opinion, which they have declined and assured me they preferred to proceed despite the risks   Procedure in detail: The patient was both verbally and visually identified by myself, the nursing staff, and anesthesia staff in the preoperative holding area. They were then transferred to the operating room and placed on the operative table in supine position.  Tourniquet was inflated at 250 mmHg pressure.  Attention  was directed to the left lateral foot at the fifth metatarsal, a skin marker was used to delineate a longitudinal incision 4 cm in length.  #15 blade was used to carry the incision from epidermal dermal down to the subcutaneous tissue.  Or neurovascular bundle was retracted and carefully preserved.  Blunt and sharp dissection was utilized to expose the capsule.  Using #15 blade periosteal incision was carried out and reflected in standard technique.  At this time the fifth metatarsal head was exposed.  Using sagittal saw hockey-stick with long plantar arm and cut was made in standard technique.  The capital fragment was medially shifted and held in place with a temporary K wire.  2 points of fixation was inserted.  At this time C-arm was utilized to check for correction.  Good correction alignment noted.  At this time the capital fragment was held in place with 2.0 millimeter screw Acumed x1.  Final fluoroscopy was used to confirm the position.  Good position noted.  Saline solution was utilized to irrigate the incision.  The incision was closed with 3-0 Monocryl, 4-0 Monocryl and 5-0 Monocryl.  The incision was dressed with Xeroform, 4 x 4 gauze, Kerlix and Ace bandage.  All bony prominences were adequately padded.  Tourniquet was deflated at total time of 28 minutes.  At the conclusion of the procedure the patient was awoken from anesthesia and found to have tolerated the procedure well any complications. There were transferred to PACU with vital signs stable and vascular status intact.  Nicholes Rough, DPM

## 2021-05-08 NOTE — Interval H&P Note (Signed)
History and Physical Interval Note:  05/08/2021 7:22 AM  Robert Nunez  has presented today for surgery, with the diagnosis of TAILORS BUNION.  The various methods of treatment have been discussed with the patient and family. After consideration of risks, benefits and other options for treatment, the patient has consented to  Procedure(s): TAILORS BUNIONECTOMY FIFTH TOE LEFT FOOT (Left) as a surgical intervention.  The patient's history has been reviewed, patient examined, no change in status, stable for surgery.  I have reviewed the patient's chart and labs.  Questions were answered to the patient's satisfaction.     Candelaria Stagers

## 2021-05-08 NOTE — Progress Notes (Signed)
Assisted Dr. Houser with left, ultrasound guided, popliteal block. Side rails up, monitors on throughout procedure. See vital signs in flow sheet. Tolerated Procedure well. 

## 2021-05-08 NOTE — Addendum Note (Signed)
Addendum  created 05/08/21 1317 by Norva Pavlov, CRNA   Charge Capture section accepted

## 2021-05-08 NOTE — Progress Notes (Signed)
Orthopedic Tech Progress Note Patient Details:  Robert Nunez 09/21/1986 [id number]  Ortho Devices Type of Ortho Device: CAM walker Ortho Device/Splint Location: left Ortho Device/Splint Interventions: Application   Post Interventions Patient Tolerated: Well Instructions Provided: Care of device, Adjustment of device  [name] 05/08/2021, 10:40 AM

## 2021-05-08 NOTE — Anesthesia Procedure Notes (Signed)
Anesthesia Regional Block: Popliteal block   Pre-Anesthetic Checklist: , timeout performed,  Correct Patient, Correct Site, Correct Laterality,  Correct Procedure, Correct Position, site marked,  Risks and benefits discussed,  Pre-op evaluation,  At surgeon's request and post-op pain management  Laterality: Lower and Left  Prep: Maximum Sterile Barrier Precautions used, chloraprep       Needles:  Injection technique: Single-shot  Needle Type: Echogenic Needle     Needle Length: 9cm  Needle Gauge: 21     Additional Needles:   Procedures:,,,, ultrasound used (permanent image in chart),,    Narrative:  Start time: 05/08/2021 7:15 AM End time: 05/08/2021 7:22 AM Injection made incrementally with aspirations every 5 mL.  Performed by: Personally  Anesthesiologist: Trevor Iha, MD  Additional Notes: Block assessed. Patient tolerated procedure well.

## 2021-05-08 NOTE — Transfer of Care (Signed)
Immediate Anesthesia Transfer of Care Note  Patient: Robert Nunez  Procedure(s) Performed: Procedure(s) (LRB): TAILORS BUNIONECTOMY FIFTH TOE LEFT FOOT (Left)  Patient Location: PACU  Anesthesia Type:MAC  Level of Consciousness: sleepy  Airway & Oxygen Therapy: Patient Spontanous Breathing and Patient connected to nasal cannula oxygen  Post-op Assessment: Report given to PACU RN and Post -op Vital signs reviewed and stable  Post vital signs: Reviewed and stable  Complications: No apparent anesthesia complications  Last Vitals:  Vitals Value Taken Time  BP 95/65 05/08/21 0830  Temp    Pulse 50 05/08/21 0836  Resp 11 05/08/21 0836  SpO2 100 % 05/08/21 0836  Vitals shown include unvalidated device data.  Last Pain:  Vitals:   05/08/21 0601  TempSrc: Oral  PainSc: 0-No pain      Patients Stated Pain Goal: 4 (61/22/44 [id number])  Complications: No notable events documented.

## 2021-05-08 NOTE — Anesthesia Postprocedure Evaluation (Signed)
Anesthesia Post Note  Patient: Robert Nunez  Procedure(s) Performed: Ruthann Cancer FIFTH TOE LEFT FOOT (Left: Foot)     Patient location during evaluation: PACU Anesthesia Type: MAC and Regional Level of consciousness: awake and alert Pain management: pain level controlled Vital Signs Assessment: post-procedure vital signs reviewed and stable Respiratory status: spontaneous breathing, nonlabored ventilation, respiratory function stable and patient connected to nasal cannula oxygen Cardiovascular status: stable and blood pressure returned to baseline Postop Assessment: no apparent nausea or vomiting Anesthetic complications: no   No notable events documented.  Last Vitals:  Vitals:   05/08/21 1015 05/08/21 1036  BP: 99/78 104/74  Pulse: (!) 44 (!) 50  Resp: 11 10  Temp:  (!) 36.4 C  SpO2: 100% 100%    Last Pain:  Vitals:   05/08/21 1036  TempSrc:   PainSc: 0-No pain                 Barnet Glasgow

## 2021-05-09 ENCOUNTER — Encounter (HOSPITAL_BASED_OUTPATIENT_CLINIC_OR_DEPARTMENT_OTHER): Payer: Self-pay | Admitting: Podiatry

## 2021-05-17 ENCOUNTER — Other Ambulatory Visit: Payer: Self-pay

## 2021-05-17 ENCOUNTER — Encounter: Payer: Self-pay | Admitting: Podiatry

## 2021-05-17 ENCOUNTER — Ambulatory Visit (INDEPENDENT_AMBULATORY_CARE_PROVIDER_SITE_OTHER): Payer: Medicaid Other

## 2021-05-17 ENCOUNTER — Ambulatory Visit (INDEPENDENT_AMBULATORY_CARE_PROVIDER_SITE_OTHER): Payer: Medicaid Other | Admitting: Podiatry

## 2021-05-17 DIAGNOSIS — Z9889 Other specified postprocedural states: Secondary | ICD-10-CM

## 2021-05-17 DIAGNOSIS — M21622 Bunionette of left foot: Secondary | ICD-10-CM

## 2021-05-19 ENCOUNTER — Encounter: Payer: Self-pay | Admitting: Podiatry

## 2021-05-19 NOTE — Progress Notes (Signed)
  Subjective:  Patient ID: Robert Nunez, male    DOB: 07-01-1986,  MRN: [id number]  Chief Complaint  Patient presents with   Routine Post Op    POS DOS 7.25.22    DOS: 05/08/2021 Procedure: Left tailor's bunion correction  35 y.o. male returns for post-op check.  Patient states that he is doing well.  Occasional numbness and tingling.  He is doing good with pain medication.  He denies any other acute complaints.  He has been ambulating with a surgical shoe.  Review of Systems: Negative except as noted in the HPI. Denies N/V/F/Ch.  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 05/2019   positive quaniferon gold test for tb in epic 05-26-2019, cxr done 05-29-2019 was negative ;  pt referrred to health department and completed 3 month treatment   Sickle cell trait (HCC)     Current Outpatient Medications:    ibuprofen (ADVIL) 800 MG tablet, Take 1 tablet (800 mg total) by mouth every 6 (six) hours as needed., Disp: 60 tablet, Rfl: 1   oxyCODONE-acetaminophen (PERCOCET) 5-325 MG tablet, Take 1-2 tablets by mouth every 4 (four) hours as needed for severe pain., Disp: 30 tablet, Rfl: 0  Social History   Tobacco Use  Smoking Status Every Day   Years: 10.00   Types: Cigarettes  Smokeless Tobacco Never  Tobacco Comments   05-02-2021  10-12 cig/day    No Known Allergies Objective:  There were no vitals filed for this visit. There is no height or weight on file to calculate BMI. Constitutional Well developed. Well nourished.  Vascular Foot warm and well perfused. Capillary refill normal to all digits.   Neurologic Normal speech. Oriented to person, place, and time. Epicritic sensation to light touch grossly present bilaterally.  Dermatologic Skin healing well without signs of infection. Skin edges well coapted  without signs of infection.  Orthopedic: Tenderness to palpation noted about the surgical site.   Radiographs: 3 views of skeletally mature adult left foot: Good correction noted.  Hardware is intact.  No signs of loosening or backing out noted. Assessment:   1. Tailor's bunion of left foot   2. Status post foot surgery    Plan:  Patient was evaluated and treated and all questions answered.  S/p foot surgery left -Progressing as expected post-operatively. -XR: See above -WB Status: Weightbearing as tolerated in surgical shoe -Sutures: Intact.  No clinical signs of dehiscence noted.  No complication noted. -Medications: None -Foot redressed.  No follow-ups on file.

## 2021-05-31 ENCOUNTER — Encounter: Payer: Self-pay | Admitting: Podiatry

## 2021-05-31 ENCOUNTER — Other Ambulatory Visit: Payer: Self-pay

## 2021-05-31 ENCOUNTER — Ambulatory Visit (INDEPENDENT_AMBULATORY_CARE_PROVIDER_SITE_OTHER): Payer: Medicaid Other | Admitting: Podiatry

## 2021-05-31 DIAGNOSIS — M21622 Bunionette of left foot: Secondary | ICD-10-CM

## 2021-05-31 DIAGNOSIS — Z9889 Other specified postprocedural states: Secondary | ICD-10-CM

## 2021-06-01 NOTE — Progress Notes (Signed)
  Subjective:  Patient ID: Robert Nunez, male    DOB: July 07, 1986,  MRN: [id number]  Chief Complaint  Patient presents with   Routine Post Op    POS DOS 7.25.22    DOS: 05/08/2021 Procedure: Left tailor's bunion correction  35 y.o. male returns for post-op check.  Patient states that he is doing well.  Occasional numbness and tingling.  He is doing good with pain medication.  He denies any other acute complaints.  He has been ambulating with a surgical shoe.  Review of Systems: Negative except as noted in the HPI. Denies N/V/F/Ch.  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 05/2019   positive quaniferon gold test for tb in epic 05-26-2019, cxr done 05-29-2019 was negative ;  pt referrred to health department and completed 3 month treatment   Sickle cell trait (HCC)     Current Outpatient Medications:    ibuprofen (ADVIL) 800 MG tablet, Take 1 tablet (800 mg total) by mouth every 6 (six) hours as needed., Disp: 60 tablet, Rfl: 1   oxyCODONE-acetaminophen (PERCOCET) 5-325 MG tablet, Take 1-2 tablets by mouth every 4 (four) hours as needed for severe pain., Disp: 30 tablet, Rfl: 0  Social History   Tobacco Use  Smoking Status Every Day   Years: 10.00   Types: Cigarettes  Smokeless Tobacco Never  Tobacco Comments   05-02-2021  10-12 cig/day    No Known Allergies Objective:  There were no vitals filed for this visit. There is no height or weight on file to calculate BMI. Constitutional Well developed. Well nourished.  Vascular Foot warm and well perfused. Capillary refill normal to all digits.   Neurologic Normal speech. Oriented to person, place, and time. Epicritic sensation to light touch grossly present bilaterally.  Dermatologic Skin completely epithelialized.  No complication noted.  Good  correction alignment noted  Orthopedic: No further tenderness to palpation noted about the surgical site.   Radiographs: 3 views of skeletally mature adult left foot: Good correction noted.  Hardware is intact.  No signs of loosening or backing out noted. Assessment:   1. Tailor's bunion of left foot   2. Status post foot surgery     Plan:  Patient was evaluated and treated and all questions answered.  S/p foot surgery left -Progressing as expected post-operatively. -XR: See above -WB Status: Weight-bear as tolerated in regular shoes -Sutures: Removed no clinical signs of dehiscence noted.  No complication noted. -Medications: None -Patient is clinically healed and is doing well.  He has transition to regular shoes without any complication.  At this time patient is officially discharged from my care if any foot and ankle issues arise future I asked him to come see me.  No follow-ups on file.

## 2022-02-20 ENCOUNTER — Emergency Department (HOSPITAL_COMMUNITY)
Admission: EM | Admit: 2022-02-20 | Discharge: 2022-02-21 | Discharge status: Against Medical Advice | Payer: Medicaid Other | Attending: Emergency Medicine | Admitting: Emergency Medicine

## 2022-02-20 ENCOUNTER — Other Ambulatory Visit: Payer: Self-pay

## 2022-02-20 DIAGNOSIS — S01511A Laceration without foreign body of lip, initial encounter: Secondary | ICD-10-CM | POA: Diagnosis not present

## 2022-02-20 DIAGNOSIS — W268XXA Contact with other sharp object(s), not elsewhere classified, initial encounter: Secondary | ICD-10-CM | POA: Diagnosis not present

## 2022-02-20 DIAGNOSIS — Z5321 Procedure and treatment not carried out due to patient leaving prior to being seen by health care provider: Secondary | ICD-10-CM | POA: Diagnosis not present

## 2022-02-20 NOTE — ED Triage Notes (Addendum)
Pt c/o lac to upper right lip after shaving today. Bleeding controlled.Lac is a very small pinpoint size. ?

## 2022-02-21 NOTE — ED Notes (Signed)
Pt reported he is ready to leave. Pt is leaving with his wife. Lip is no longer bleeding. ?

## 2022-02-21 NOTE — ED Notes (Signed)
No bleeding noted at this time

## 2022-02-21 NOTE — ED Provider Triage Note (Signed)
Emergency Medicine Provider Triage Evaluation Note ? ?Omir I Marcille Buffy , a 36 y.o. male  was evaluated in triage.  Pt complains of small bleeding after shaving today. He reports that it was still bleeding and wouldn't stop until they applied liquid bandage. Reports tetanus updated two years ago. Denies any bleeding disorders.  ? ?Review of Systems  ?Positive:  ?Negative:  ? ?Physical Exam  ?BP 107/68 (BP Location: Right Arm)   Pulse 66   Temp 98.6 ?F (37 ?C) (Oral)   Resp 16   SpO2 100%  ?Gen:   Awake, no distress   ?Resp:  Normal effort  ?MSK:   Moves extremities without difficulty  ?Other:  Very small, pinpoint area that is not bleeding just superior to the vermillion border of the upper lip on the right.   ? ?Medical Decision Making  ?Medically screening exam initiated at 12:57 AM.  Appropriate orders placed.  Patricia I Nin was informed that the remainder of the evaluation will be completed by another provider, this initial triage assessment does not replace that evaluation, and the importance of remaining in the ED until their evaluation is complete. ? ?Area was vigorously cleaned with saline and gauze. The area started to very slightly ooze. Asked patient to apply pressure. I do not think any suturing is needed. Sent back to waiting room. ? ?Interpreter used during this encounter.  ?  ?Achille Rich, PA-C ?02/21/22 0100 ? ?

## 2022-02-21 NOTE — ED Notes (Signed)
PA looked at patient lip to further exam it.PA notice small size pinpoint lac to lip. ?

## 2023-03-07 IMAGING — DX DG FOOT COMPLETE 3+V*L*
3 series · 3 of 3 positions shown · non-contrast
Comparison: None.

CLINICAL DATA: Post left foot surgery

EXAM:
LEFT FOOT - COMPLETE 3+ VIEW

[foot ap]
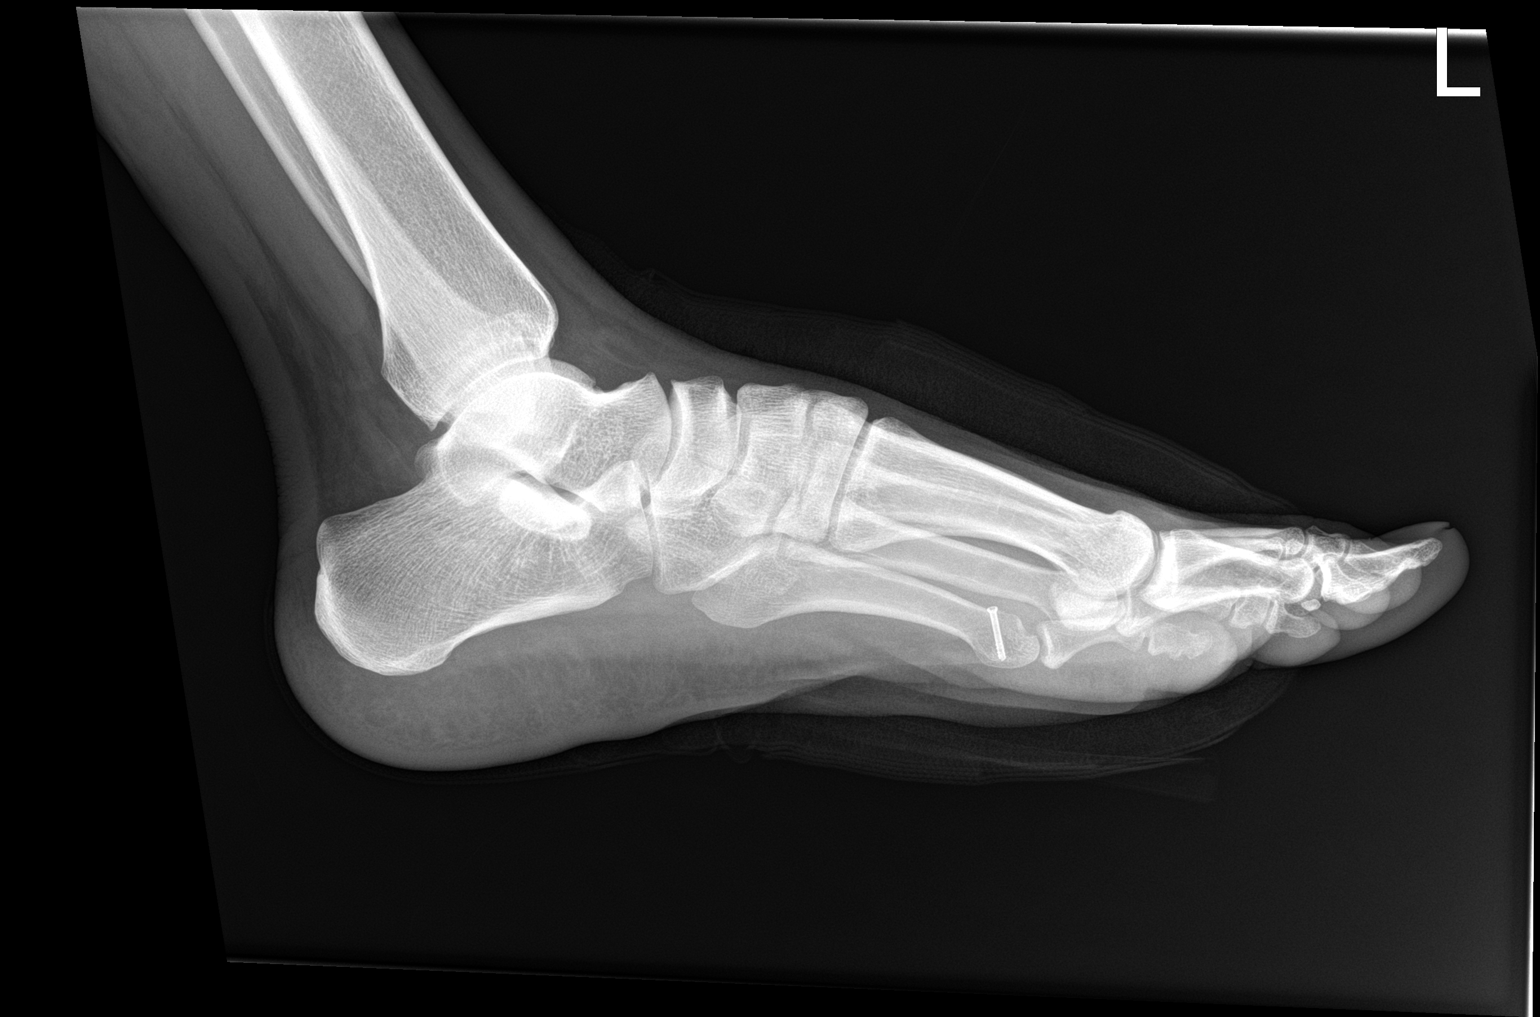

[foot obl]
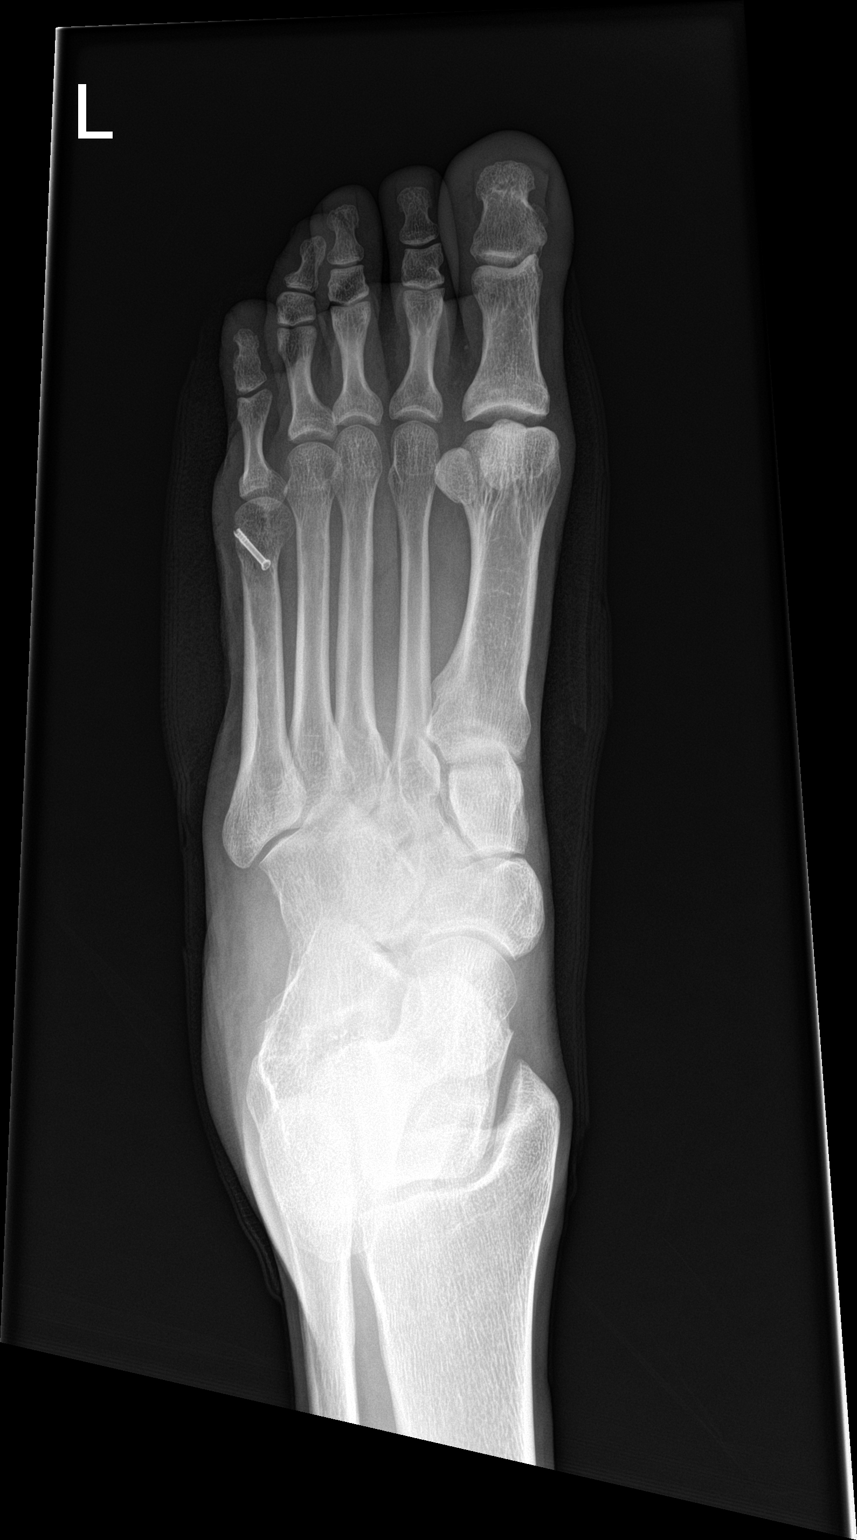

[foot lat]
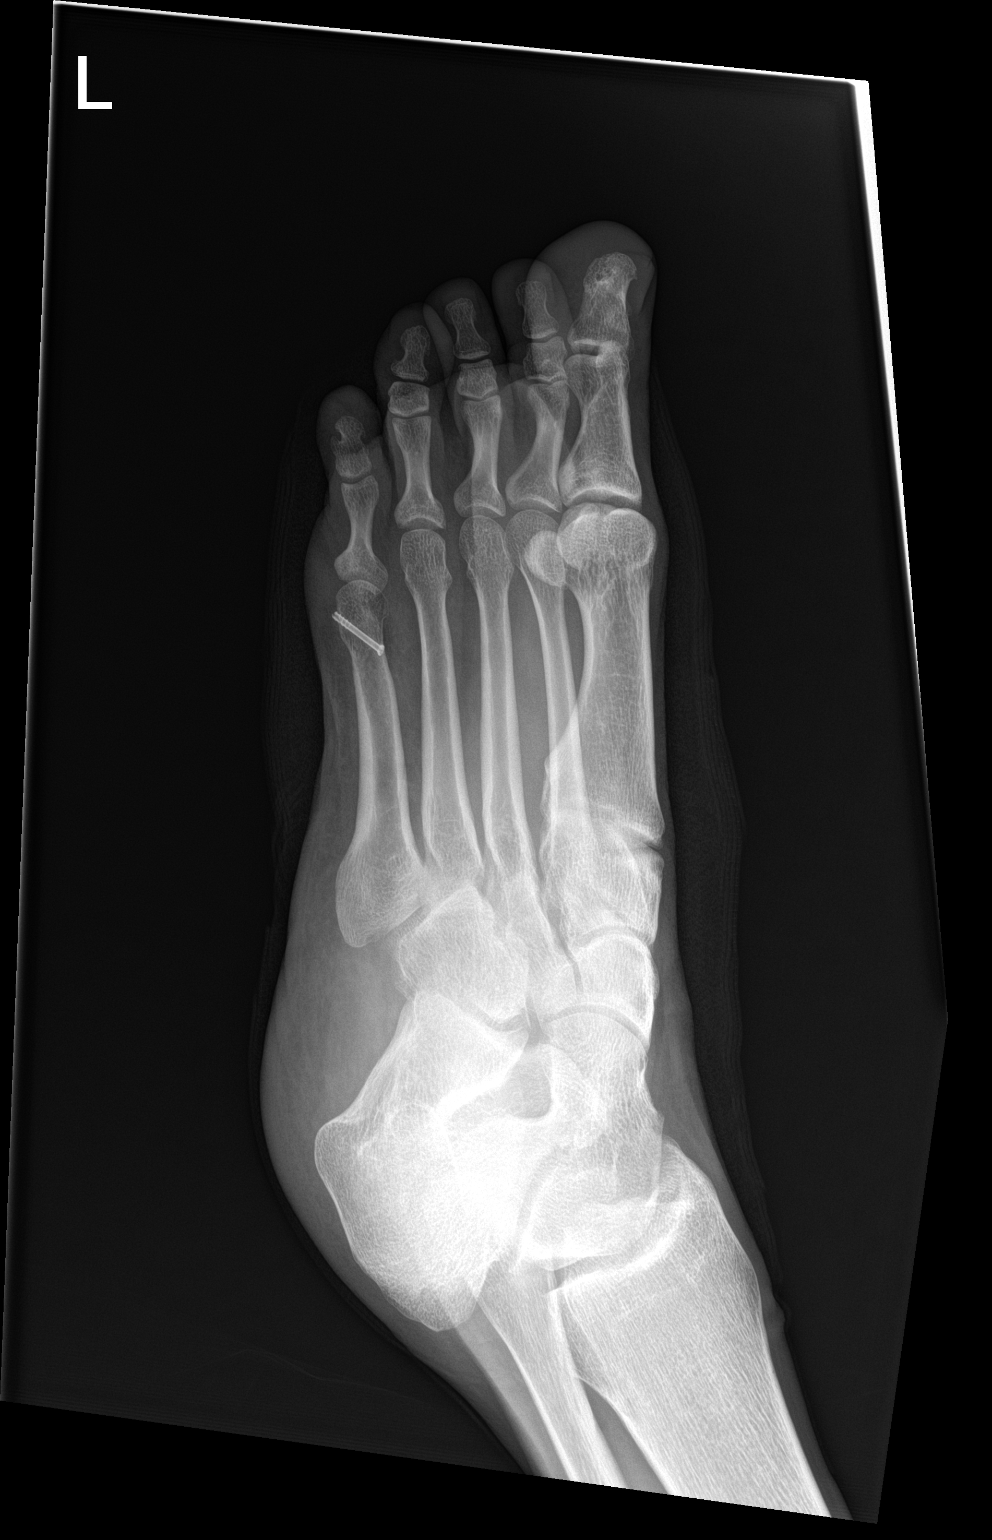

[3 of 3 positions shown; findings below may reference images not displayed]

FINDINGS: Screw noted within the distal left 5th metatarsal. No complicating
feature. No acute fracture, subluxation or dislocation. Soft tissues
are intact.
IMPRESSION: Postoperative changes in the 5th metatarsal.  No acute findings.
